# Patient Record
Sex: Female | Born: 1970 | Race: White | Hispanic: No | Marital: Married | Smoking: Never smoker
Health system: Southern US, Community
[De-identification: ages and names within clinical notes are randomized; demographics above are authoritative.]

## PROBLEM LIST (undated history)

## (undated) DIAGNOSIS — F329 Major depressive disorder, single episode, unspecified: Secondary | ICD-10-CM

## (undated) DIAGNOSIS — A64 Unspecified sexually transmitted disease: Secondary | ICD-10-CM

## (undated) DIAGNOSIS — R32 Unspecified urinary incontinence: Secondary | ICD-10-CM

## (undated) DIAGNOSIS — D219 Benign neoplasm of connective and other soft tissue, unspecified: Secondary | ICD-10-CM

## (undated) DIAGNOSIS — K625 Hemorrhage of anus and rectum: Secondary | ICD-10-CM

## (undated) DIAGNOSIS — K8020 Calculus of gallbladder without cholecystitis without obstruction: Secondary | ICD-10-CM

## (undated) DIAGNOSIS — N83209 Unspecified ovarian cyst, unspecified side: Secondary | ICD-10-CM

## (undated) DIAGNOSIS — F419 Anxiety disorder, unspecified: Secondary | ICD-10-CM

## (undated) DIAGNOSIS — M419 Scoliosis, unspecified: Secondary | ICD-10-CM

## (undated) DIAGNOSIS — C9210 Chronic myeloid leukemia, BCR/ABL-positive, not having achieved remission: Secondary | ICD-10-CM

## (undated) DIAGNOSIS — N200 Calculus of kidney: Secondary | ICD-10-CM

## (undated) DIAGNOSIS — L989 Disorder of the skin and subcutaneous tissue, unspecified: Secondary | ICD-10-CM

## (undated) DIAGNOSIS — K859 Acute pancreatitis without necrosis or infection, unspecified: Secondary | ICD-10-CM

## (undated) DIAGNOSIS — B958 Unspecified staphylococcus as the cause of diseases classified elsewhere: Secondary | ICD-10-CM

## (undated) DIAGNOSIS — D649 Anemia, unspecified: Secondary | ICD-10-CM

## (undated) DIAGNOSIS — N946 Dysmenorrhea, unspecified: Secondary | ICD-10-CM

## (undated) DIAGNOSIS — K834 Spasm of sphincter of Oddi: Secondary | ICD-10-CM

## (undated) DIAGNOSIS — D759 Disease of blood and blood-forming organs, unspecified: Secondary | ICD-10-CM

## (undated) DIAGNOSIS — F341 Dysthymic disorder: Secondary | ICD-10-CM

## (undated) DIAGNOSIS — F32A Depression, unspecified: Secondary | ICD-10-CM

## (undated) DIAGNOSIS — J349 Unspecified disorder of nose and nasal sinuses: Secondary | ICD-10-CM

## (undated) DIAGNOSIS — E079 Disorder of thyroid, unspecified: Secondary | ICD-10-CM

## (undated) HISTORY — PX: BACK SURGERY: SHX140

## (undated) HISTORY — DX: Anxiety disorder, unspecified: F41.9

## (undated) HISTORY — DX: Depression, unspecified: F32.A

## (undated) HISTORY — PX: BILE DUCT EXPLORATION: SHX1225

## (undated) HISTORY — PX: ABDOMINAL HYSTERECTOMY: SHX81

## (undated) HISTORY — DX: Benign neoplasm of connective and other soft tissue, unspecified: D21.9

## (undated) HISTORY — DX: Anemia, unspecified: D64.9

## (undated) HISTORY — DX: Unspecified urinary incontinence: R32

## (undated) HISTORY — PX: DILATION AND CURETTAGE OF UTERUS: SHX78

## (undated) HISTORY — DX: Major depressive disorder, single episode, unspecified: F32.9

## (undated) HISTORY — DX: Scoliosis, unspecified: M41.9

## (undated) HISTORY — DX: Unspecified sexually transmitted disease: A64

## (undated) HISTORY — DX: Dysmenorrhea, unspecified: N94.6

## (undated) HISTORY — PX: CHOLECYSTECTOMY: SHX55

## (undated) HISTORY — DX: Disease of blood and blood-forming organs, unspecified: D75.9

---

## 1996-04-19 DIAGNOSIS — A64 Unspecified sexually transmitted disease: Secondary | ICD-10-CM

## 1996-04-19 HISTORY — DX: Unspecified sexually transmitted disease: A64

## 2005-04-19 HISTORY — PX: TUMOR REMOVAL: SHX12

## 2005-04-19 HISTORY — PX: ESSURE TUBAL LIGATION: SUR464

## 2007-04-20 DIAGNOSIS — D759 Disease of blood and blood-forming organs, unspecified: Secondary | ICD-10-CM

## 2007-04-20 HISTORY — DX: Disease of blood and blood-forming organs, unspecified: D75.9

## 2011-04-20 HISTORY — PX: UMBILICAL HERNIA REPAIR: SHX196

## 2013-03-09 ENCOUNTER — Ambulatory Visit
Admission: RE | Admit: 2013-03-09 | Discharge: 2013-03-09 | Disposition: A | Discharge status: Home / Self Care | Payer: 59 | Source: Ambulatory Visit | Attending: Family Medicine | Admitting: Family Medicine

## 2013-03-09 ENCOUNTER — Other Ambulatory Visit: Payer: Self-pay | Admitting: Family Medicine

## 2013-03-09 DIAGNOSIS — N200 Calculus of kidney: Secondary | ICD-10-CM

## 2013-04-04 ENCOUNTER — Encounter (HOSPITAL_BASED_OUTPATIENT_CLINIC_OR_DEPARTMENT_OTHER): Payer: Self-pay | Admitting: Emergency Medicine

## 2013-04-04 ENCOUNTER — Emergency Department (HOSPITAL_COMMUNITY): Payer: 59

## 2013-04-04 ENCOUNTER — Emergency Department (HOSPITAL_BASED_OUTPATIENT_CLINIC_OR_DEPARTMENT_OTHER): Payer: 59

## 2013-04-04 ENCOUNTER — Emergency Department (HOSPITAL_BASED_OUTPATIENT_CLINIC_OR_DEPARTMENT_OTHER)
Admission: EM | Admit: 2013-04-04 | Discharge: 2013-04-04 | Disposition: A | Discharge status: Home / Self Care | Payer: 59 | Attending: Emergency Medicine | Admitting: Emergency Medicine

## 2013-04-04 DIAGNOSIS — N200 Calculus of kidney: Secondary | ICD-10-CM | POA: Insufficient documentation

## 2013-04-04 DIAGNOSIS — R109 Unspecified abdominal pain: Secondary | ICD-10-CM | POA: Insufficient documentation

## 2013-04-04 DIAGNOSIS — E079 Disorder of thyroid, unspecified: Secondary | ICD-10-CM | POA: Insufficient documentation

## 2013-04-04 DIAGNOSIS — L989 Disorder of the skin and subcutaneous tissue, unspecified: Secondary | ICD-10-CM | POA: Insufficient documentation

## 2013-04-04 DIAGNOSIS — N83209 Unspecified ovarian cyst, unspecified side: Secondary | ICD-10-CM | POA: Insufficient documentation

## 2013-04-04 DIAGNOSIS — K859 Acute pancreatitis without necrosis or infection, unspecified: Secondary | ICD-10-CM | POA: Insufficient documentation

## 2013-04-04 HISTORY — DX: Acute pancreatitis without necrosis or infection, unspecified: K85.90

## 2013-04-04 HISTORY — DX: Unspecified ovarian cyst, unspecified side: N83.209

## 2013-04-04 HISTORY — DX: Disorder of the skin and subcutaneous tissue, unspecified: L98.9

## 2013-04-04 HISTORY — DX: Calculus of kidney: N20.0

## 2013-04-04 HISTORY — DX: Unspecified disorder of nose and nasal sinuses: J34.9

## 2013-04-04 HISTORY — DX: Spasm of sphincter of Oddi: K83.4

## 2013-04-04 HISTORY — DX: Disorder of thyroid, unspecified: E07.9

## 2013-04-04 LAB — WET PREP, GENITAL: Trich, Wet Prep: NONE SEEN

## 2013-04-04 LAB — COMPREHENSIVE METABOLIC PANEL
ALT: 21 U/L (ref 0–35)
AST: 22 U/L (ref 0–37)
Alkaline Phosphatase: 95 U/L (ref 39–117)
CO2: 22 mEq/L (ref 19–32)
Calcium: 9.5 mg/dL (ref 8.4–10.5)
Chloride: 103 mEq/L (ref 96–112)
GFR calc Af Amer: >90 mL/min (ref >90)
GFR calc non Af Amer: >90 mL/min (ref >90)
Glucose, Bld: 107 mg/dL — ABNORMAL HIGH (ref 70–99)
Potassium: 3.7 mEq/L (ref 3.5–5.1)
Sodium: 138 mEq/L (ref 135–145)
Total Bilirubin: 0.3 mg/dL (ref 0.3–1.2)
Total Protein: 7.8 g/dL (ref 6.0–8.3)

## 2013-04-04 LAB — CBC WITH DIFFERENTIAL/PLATELET
Basophils Relative: 1 % (ref 0–1)
Eosinophils Relative: 5 % (ref 0–5)
Lymphocytes Relative: 26 % (ref 12–46)
Lymphs Abs: 2 10*3/uL (ref 0.7–4.0)
MCHC: 33.7 g/dL (ref 30.0–36.0)
MCV: 88.8 fL (ref 78.0–100.0)
Neutro Abs: 4.6 10*3/uL (ref 1.7–7.7)
Platelets: 374 10*3/uL (ref 150–400)
RBC: 4.38 MIL/uL (ref 3.87–5.11)
RDW: 12.7 % (ref 11.5–15.5)
WBC: 7.7 10*3/uL (ref 4.0–10.5)

## 2013-04-04 LAB — URINALYSIS, ROUTINE W REFLEX MICROSCOPIC
Bilirubin Urine: NEGATIVE
Ketones, ur: NEGATIVE mg/dL
Nitrite: NEGATIVE
Specific Gravity, Urine: 1.017 (ref 1.005–1.030)
Urobilinogen, UA: 0.2 mg/dL (ref 0.0–1.0)
pH: 5.5 (ref 5.0–8.0)

## 2013-04-04 MED ORDER — OXYCODONE-ACETAMINOPHEN 5-325 MG PO TABS: 2.0000 | ORAL_TABLET | ORAL | Status: DC | PRN

## 2013-04-04 MED ORDER — ONDANSETRON HCL 4 MG/2ML IJ SOLN
4.0000 mg | Freq: Once | INTRAMUSCULAR | Status: AC
  Administered 2013-04-04: 4 mg via INTRAVENOUS
  Filled 2013-04-04: qty 2

## 2013-04-04 MED ORDER — KETOROLAC TROMETHAMINE 30 MG/ML IJ SOLN
30.0000 mg | Freq: Once | INTRAMUSCULAR | Status: AC
  Administered 2013-04-04: 30 mg via INTRAVENOUS

## 2013-04-04 MED ORDER — NAPROXEN 500 MG PO TABS: 500.0000 mg | ORAL_TABLET | Freq: Two times a day (BID) | ORAL | Status: DC

## 2013-04-04 MED ORDER — IOHEXOL 300 MG/ML  SOLN
100.0000 mL | Freq: Once | INTRAMUSCULAR | Status: AC | PRN
  Administered 2013-04-04: 100 mL via INTRAVENOUS

## 2013-04-04 MED ORDER — KETOROLAC TROMETHAMINE 30 MG/ML IJ SOLN
INTRAMUSCULAR | Status: AC
  Filled 2013-04-04: qty 1

## 2013-04-04 MED ORDER — GLYCOPYRROLATE 0.2 MG/ML IJ SOLN
0.1000 mg | Freq: Once | INTRAMUSCULAR | Status: AC
  Administered 2013-04-04: 0.1 mg via INTRAVENOUS
  Filled 2013-04-04: qty 1

## 2013-04-04 MED ORDER — IOHEXOL 300 MG/ML  SOLN
50.0000 mL | Freq: Once | INTRAMUSCULAR | Status: AC | PRN
  Administered 2013-04-04: 50 mL via ORAL

## 2013-04-04 MED ORDER — HYDROMORPHONE HCL PF 1 MG/ML IJ SOLN
1.0000 mg | Freq: Once | INTRAMUSCULAR | Status: AC
  Administered 2013-04-04: 1 mg via INTRAMUSCULAR
  Filled 2013-04-04: qty 1

## 2013-04-04 NOTE — ED Provider Notes (Signed)
CT scan shows to some in her right adnexal cyst. No free fluid in the pelvis. No sign of appendicitis. No hydroureteronephrosis. Think her symptoms are consistent with an ovarian cyst. This began slowly 2 days ago was not sudden in onset I do not think this is consistent with torsion. Plan will be sent control. GYN referral. Anti-inflammatories.  Roney Marion, MD 04/04/13 [phone]

## 2013-04-04 NOTE — ED Provider Notes (Signed)
Please see Dr. Sandi Carne note above.  Pt examined.  C/O midline suprapubic pain.  Seems to be uncomfortable, but not writhing in pain.  H/o kidney stones, but states this is different.  H/O ovarian cysts, but it's been some time and can't remember that.  LMP Mid November, normal for Patient.  No peritoneal irritation. Normal WBC.  Does have trace Hb on Dip UA.  CT ordered, and pending.  Offered pain meds, declines.  Given Zofran and robinul.  Pt takes Oxycontin 30 mg bid for LBP, and has not ran out or had change in dosage.  Roney Marion, MD 04/04/13 2220

## 2013-04-04 NOTE — ED Notes (Signed)
Patient transported to CT 

## 2013-04-04 NOTE — ED Notes (Signed)
Bed: RU04 Expected date:  Expected time:  Means of arrival:  Comments: tx [facility]

## 2013-04-04 NOTE — ED Notes (Signed)
Pt to driver herself to [facility] hospital for ct abdomen, constrast given for patient to drink as she is traveling to [place] [facility]

## 2013-04-04 NOTE — ED Notes (Signed)
Pt given emtala paperwork for tx to [place], pt drinking contrast, tiffany, charge RN at [facility] long notified of patient's arrival and that she is going pov and IV site.

## 2013-04-04 NOTE — ED Provider Notes (Signed)
CSN: [id number]     Arrival date & time 04/04/13  1733 History  This chart was scribed for Mariah Lyons, MD by Leone Payor, ED Scribe. This patient was seen in room MH02/MH02 and the patient's care was started 6:08 PM.    Chief Complaint  Patient presents with  . Abdominal Pain    The history is provided by the patient. No language interpreter was used.    HPI Comments: Mariah Reyes is a 42 y.o. female who presents to the Emergency Department complaining of constant, gradually worsening lower abdominal and lower back pain that began 3 days ago. She has associated cloudy urine and genital itching and burning. She also reports having a low grade fever of around 99. She reports having a leftover prescription of Diflucan which she has taken. Pt also reports having a history of back pain but she states this pain is different. She takes Oxycodone 30 mg 2-3 times per day which is prescribed by pain management. She has a surgical history of cholecystectomy.    Past Medical History  Diagnosis Date  . Kidney stones   . Skin disorder   . Pancreatitis   . Sphincter of Oddi dysfunction   . Ovarian cyst   . Sinus disease   . Thyroid disease    Past Surgical History  Procedure Laterality Date  . Cholecystectomy    . Back surgery     No family history on file. History  Substance Use Topics  . Smoking status: Never Smoker   . Smokeless tobacco: Not on file  . Alcohol Use: Yes   OB History   Grav Para Term Preterm Abortions TAB SAB Ect Mult Living                 Review of Systems A complete 10 system review of systems was obtained and all systems are negative except as noted in the HPI and PMH.   Allergies  Ciprofloxacin hcl and Latex  Home Medications   Current Outpatient Rx  Name  Route  Sig  Dispense  Refill  . fexofenadine-pseudoephedrine (ALLEGRA-D) 60-120 MG per tablet   Oral   Take 1 tablet by mouth 2 (two) times daily.         Marland Kitchen FLUoxetine (PROZAC) 40 MG capsule  Oral   Take 40 mg by mouth daily.         Marland Kitchen levothyroxine (SYNTHROID, LEVOTHROID) 25 MCG tablet   Oral   Take 25 mcg by mouth daily before breakfast.          BP 125/82  Pulse 94  Temp(Src) 98.4 F (36.9 C) (Oral)  Resp 16  SpO2 100%  LMP 03/16/2013 Physical Exam  Nursing note and vitals reviewed. Constitutional: She is oriented to person, place, and time. She appears well-developed and well-nourished.  Pt appears anxious and jittery.   HENT:  Head: Normocephalic and atraumatic.  Cardiovascular: Normal rate, regular rhythm and normal heart sounds.   Pulmonary/Chest: Effort normal and breath sounds normal. No respiratory distress.  Abdominal: Soft. She exhibits no distension. There is tenderness. There is no rebound and no guarding.  Tenderness to palpation across the lower abdomen in the RLQ and LLQ and suprapubic region.   Genitourinary: Uterus normal. Vaginal discharge found.  There is a mild to moderate whitish vaginal discharge present. There is no cervical motion tenderness or adnexal tenderness or masses  Neurological: She is alert and oriented to person, place, and time.  Skin: Skin is warm and dry.  Psychiatric: She has a normal mood and affect.    ED Course  Procedures   DIAGNOSTIC STUDIES: Oxygen Saturation is 100% on RA, normal by my interpretation.    COORDINATION OF CARE: 6:13 PM Discussed treatment plan with pt at bedside and pt agreed to plan.   Labs Review Labs Reviewed  URINALYSIS, ROUTINE W REFLEX MICROSCOPIC - Abnormal; Notable for the following:    Hgb urine dipstick TRACE (*)    Leukocytes, UA SMALL (*)    All other components within normal limits  COMPREHENSIVE METABOLIC PANEL - Abnormal; Notable for the following:    Glucose, Bld 107 (*)    All other components within normal limits  PREGNANCY, URINE  CBC WITH DIFFERENTIAL  URINE MICROSCOPIC-ADD ON   Imaging Review No results found.    MDM  No diagnosis found. Patient presents  here with lower abdominal pain for the past 3 days. She is reporting severe pain in the ER, however workup is unremarkable. There is no elevation of white count in the urine is essentially clear. Pelvic examination does not reveal any obvious abnormality. As there is no explanation for her discomfort I feel as though a CT is indicated. The scanner he is not operational this evening, hence the patient will be sent to [facility] for completion of her workup. I've spoken with Dr. Fayrene Fearing who agrees to accept the patient in transfer.  I personally performed the services described in this documentation, which was scribed in my presence. The recorded information has been reviewed and is accurate.      Mariah Lyons, MD 04/04/13 2008

## 2013-04-04 NOTE — ED Notes (Signed)
Onset of burning/itching to her genital area x several weeks.  Unable to secure an appointment with her PMD, so she took a Diflucan tablet that she had left over.  Onset Sunday of lower abdominal pain, mucous in her urine.  Now abdominal pain worse, radiating into her back.  Patient is pale, c/o hot/cold flashes.  Also reports fever at home.

## 2013-04-07 LAB — GC/CHLAMYDIA PROBE AMP: GC Probe RNA: NEGATIVE

## 2013-05-04 ENCOUNTER — Ambulatory Visit (INDEPENDENT_AMBULATORY_CARE_PROVIDER_SITE_OTHER): Payer: 59 | Admitting: Obstetrics and Gynecology

## 2013-05-04 ENCOUNTER — Encounter: Payer: Self-pay | Admitting: Obstetrics and Gynecology

## 2013-05-04 ENCOUNTER — Telehealth: Payer: Self-pay | Admitting: Obstetrics and Gynecology

## 2013-05-04 VITALS — BP 120/86 | HR 86 | Resp 16 | Ht 62.75 in | Wt 165.0 lb

## 2013-05-04 DIAGNOSIS — R109 Unspecified abdominal pain: Secondary | ICD-10-CM

## 2013-05-04 DIAGNOSIS — R195 Other fecal abnormalities: Secondary | ICD-10-CM

## 2013-05-04 DIAGNOSIS — K921 Melena: Secondary | ICD-10-CM

## 2013-05-04 DIAGNOSIS — N926 Irregular menstruation, unspecified: Secondary | ICD-10-CM

## 2013-05-04 DIAGNOSIS — N939 Abnormal uterine and vaginal bleeding, unspecified: Secondary | ICD-10-CM

## 2013-05-04 DIAGNOSIS — N83209 Unspecified ovarian cyst, unspecified side: Secondary | ICD-10-CM

## 2013-05-04 DIAGNOSIS — N83201 Unspecified ovarian cyst, right side: Secondary | ICD-10-CM

## 2013-05-04 LAB — POCT URINALYSIS DIPSTICK
BILIRUBIN UA: NEGATIVE
Blood, UA: 250
GLUCOSE UA: NEGATIVE
KETONES UA: NEGATIVE
Leukocytes, UA: NEGATIVE
Nitrite, UA: NEGATIVE
Protein, UA: NEGATIVE
Urobilinogen, UA: NEGATIVE
pH, UA: 5

## 2013-05-04 NOTE — Patient Instructions (Signed)
We will call you to schedule your ultrasound, sonohysterogram, and possible biopsy - all as an in office procedure.

## 2013-05-04 NOTE — Telephone Encounter (Signed)
Gay Filler,  Please contact this patient to schedule her SHGM. I called her back but she prefers to have it done on 01.22.2015, but I explained that the schedule is pretty full. She then requested 01.29.2015, but Dr. Quincy Simmonds will be out of the office. The patient insists that she does not want to wait until February 05.  Thank you, Gabriel Cirri

## 2013-05-04 NOTE — Telephone Encounter (Signed)
Left message for patient to call back/wanted to advised patient that she is scheduled with Dr. Collene Mares 01.20.2015 @1030 Mariah Reyes wanted to go over benefits for and schedule SHGM/ssf

## 2013-05-04 NOTE — Progress Notes (Signed)
GYNECOLOGY PROBLEM VISIT  PCP: [facility]   Referring provider:   HPI: 43 y.o.   Married  Caucasian  female   [phone] with Patient's last menstrual period was 04/23/2013.   Patient is here for abnormal uterine bleeding, pelvic/abdominal pain, and a right ovarian cyst noted on pelvic ultrasound.  Bled 12/24 and 12/25 and started again 1/5 - until now.  Can skip menses 2 - 3 times a year.  When has cycles, they can be heavy.   Abdominal pain began in November. Initial concern was for a potential kidney stone.   Had a CT scan at [facility] in November 2014 which was normal. Patient had another spike in pain which prompted a second CT scan, on 04/04/13 which showed a 3 cm right ovarian cyst and an umbilical hernia.   GC/CT negative.  UPT negative.  Patient associates the spike in pain with vaginal bleeding.   Pain is a consistent cramp. Feels better when she pushes in on it or lays on her side. Does not know if anything makes it worse. Worsens when stops Aleve.   Reports fatigue and headaches.    Has had spinal surgery, discectomy with a spacer placed.  Chronic pain since then.   Some nausea.  No vomiting.  No diarrhea. No constipation.    Some black stools.   Colonoscopy one year ago.  "Looked roughed up."  States black tarry stools.  No GI physician caring for patient here.   One month ago had urinary frequency for one week. Microscopic hematuria on UA in ER.  Having nonhealing skin lesions that are nonhealing and is seeing a dermatologist soon.   GYNECOLOGIC HISTORY: Patient's last menstrual period was 04/23/2013. Sexually active:  yes Partner preference: female, no new partners.  Contraception:  Essure  Menopausal hormone therapy: no DES exposure:   no Blood transfusions:   no Sexually transmitted diseases:  Trich 1998  GYN Procedures: Essure / D&C Mammogram:   01/2013 normal              Pap: 2014 neg   History of abnormal pap smear:  Many years  ago (repeat pap normal)   OB History   Grav Para Term Preterm Abortions TAB SAB Ect Mult Living   5 1   4 3 1   1          Family History  Problem Relation Age of Onset  . Hyperlipidemia Mother   . Hypertension Father   . Breast cancer Paternal Grandmother     There are no active problems to display for this patient.   Past Medical History  Diagnosis Date  . Kidney stones   . Skin disorder     lesion on forehead at eyebrow  . Pancreatitis   . Sphincter of Oddi dysfunction   . Ovarian cyst   . Sinus disease   . Thyroid disease   . Anemia   . Anxiety   . Blood disorder 2009  . Depression   . Dysmenorrhea   . Fibroid   . Scoliosis   . STD (sexually transmitted disease) [place]  . Urinary incontinence     Past Surgical History  Procedure Laterality Date  . Cholecystectomy    . Back surgery    . Bile duct exploration  2006 2008  . Umbilical hernia repair  2013  . 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 Dilation and curettage of uterus    .  Essure tubal ligation  2007    ALLERGIES: Ciprofloxacin hcl and Latex  Current Outpatient Prescriptions  Medication Sig Dispense Refill  . ALPRAZolam (XANAX) 0.5 MG tablet Take 0.5 mg by mouth 2 (two) times daily as needed for anxiety.       Marland Kitchen amphetamine-dextroamphetamine (ADDERALL XR) 25 MG 24 hr capsule Take 25 mg by mouth every morning.      . fexofenadine-pseudoephedrine (ALLEGRA-D) 60-120 MG per tablet Take 1 tablet by mouth every morning.       Marland Kitchen FLUoxetine (PROZAC) 40 MG capsule Take 40 mg by mouth every morning.       Marland Kitchen levothyroxine (SYNTHROID, LEVOTHROID) 25 MCG tablet Take 25 mcg by mouth daily before breakfast.      . naproxen (NAPROSYN) 500 MG tablet Take 1 tablet (500 mg total) by mouth 2 (two) times daily.  30 tablet  0  . oxyCODONE-acetaminophen (PERCOCET/ROXICET) 5-325 MG per tablet Take 2 tablets by mouth every 4 (four) hours as needed.  6 tablet  0  . OXYCONTIN 30 MG T12A Take 30 mg by mouth 2  (two) times daily as needed (pain).       Marland Kitchen PROAIR HFA 108 (90 BASE) MCG/ACT inhaler as needed.       Marland Kitchen QUEtiapine (SEROQUEL) 50 MG tablet Take 50 mg by mouth at bedtime.      Marland Kitchen zolpidem (AMBIEN CR) 12.5 MG CR tablet Take 12.5 mg by mouth at bedtime.      . fluconazole (DIFLUCAN) 150 MG tablet       . Meth-Hyo-M Bl-Na Phos-Ph Sal (URIBEL) 118 MG CAPS Take 1 capsule by mouth once as needed (UTI).      Marland Kitchen ondansetron (ZOFRAN-ODT) 4 MG disintegrating tablet Take 4 mg by mouth 3 (three) times daily as needed for nausea or vomiting.       Marland Kitchen tiZANidine (ZANAFLEX) 4 MG capsule Take 4 mg by mouth as needed for muscle spasms.        No current facility-administered medications for this visit.     ROS:  Pertinent items are noted in HPI.  SOCIAL HISTORY:  Not working.   PHYSICAL EXAMINATION:    BP 120/86  Pulse 86  Resp 16  Ht 5' 2.75" (1.594 m)  Wt 165 lb (74.844 kg)  BMI 29.46 kg/m2  LMP 04/23/2013   Wt Readings from Last 3 Encounters:  05/04/13 165 lb (74.844 kg)     Ht Readings from Last 3 Encounters:  05/04/13 5' 2.75" (1.594 m)    General appearance: Alternates between agitation and sedation.  Difficult historian.  Head: Normocephalic, without obvious abnormality, atraumatic Neck: no adenopathy, supple, symmetrical, trachea midline and thyroid not enlarged, symmetric, no tenderness/mass/nodules Lungs: clear to auscultation bilaterally Heart: regular rate and rhythm Abdomen: Periumbilical incisions, soft, non-tender; no masses,  no organomegaly Extremities: extremities normal, atraumatic, no cyanosis or edema Skin: Skin color, texture, turgor normal. Ulcerative wound of the right forehead. Lymph nodes: Cervical, supraclavicular, and axillary nodes normal. No abnormal inguinal nodes palpated    Pelvic: External genitalia:  no lesions              Urethra:  normal appearing urethra with no masses, tenderness or lesions              Bartholins and Skenes: normal                  Vagina: normal appearing vagina with normal color and discharge, no lesions.  Vaginal blood noted.  No  active bleeding.               Cervix: normal appearance.  No CMT.                     Bimanual Exam:  Uterus:  uterus is normal size, shape, consistency, retroverted and tender posterior fundus.                                      Adnexa: normal adnexa in size, nontender and no masses                                      Rectovaginal: Confirms                                      Anus:  normal sphincter tone, no lesions  ASSESSMENT  Abdominal and pelvic pain of unclear etiology.   Left ovarian cyst on CT scan.  Recent menometrorrhagia.  Possible anovulatory cycle.  Status post Essure.  Status post  multiple surgeries with umbilical approach - exploratory laparoscopy to rule out ectopic, cholecystectomy, umbilical herniorrhaphy History of pancreatitis. Chronic back pain.  On narcotics. Nonhealing skin ulcers. Black stools  PLAN  Return for a pelvic ultrasound, sonohysterogram, and potential endometrial biopsy. Referral to GI for black stools and abdominal pain.    An After Visit Summary was printed and given to the patient.  25 minutes face to face time of which over 25% was spent in counseling.

## 2013-05-04 NOTE — Telephone Encounter (Signed)
Patient is calling [place] back

## 2013-05-07 NOTE — Telephone Encounter (Signed)
Work in for 05-10-13 approved by Dr Quincy Simmonds and Gabriel Cirri has notified patient. Encounter closed.

## 2013-05-07 NOTE — Telephone Encounter (Signed)
Advised that patient had questions regarding upcoming procedure.Message left to return call to Christmas at [phone].

## 2013-05-09 ENCOUNTER — Telehealth: Payer: Self-pay | Admitting: Obstetrics and Gynecology

## 2013-05-09 NOTE — Telephone Encounter (Signed)
Please have then let us know if she is able to see the patient after her records are reviewed.

## 2013-05-09 NOTE — Telephone Encounter (Signed)
Records faxed to Dr. Lorie Apley office for review.

## 2013-05-09 NOTE — Telephone Encounter (Signed)
Message for Dr Quincy Simmonds: Did not see patient yesterday had to sent away because they needed her records, has a history of chronic pancreatis and sphincter of oddi. They may not be able to help her and she may need to go to a Teaching hospital. Once they get the records Dr Collene Mares will review them and let us know what her recommendations are.

## 2013-05-09 NOTE — Telephone Encounter (Signed)
Unable to get fax to go through to Dr. Lorie Apley office. Call to office, nurse reports they need records from out of state instead of our office notes. Pt was able to tell them the name of the hospital where she was seen from 2005 to 2012 for these issues. They have requested records to be rushed and Dr. Collene Mares will review, but it is very likely they will send her to a teaching hospital.

## 2013-05-10 ENCOUNTER — Encounter: Payer: Self-pay | Admitting: Obstetrics and Gynecology

## 2013-05-10 ENCOUNTER — Other Ambulatory Visit: Payer: Self-pay | Admitting: Obstetrics and Gynecology

## 2013-05-10 ENCOUNTER — Ambulatory Visit (INDEPENDENT_AMBULATORY_CARE_PROVIDER_SITE_OTHER): Payer: 59 | Admitting: Obstetrics and Gynecology

## 2013-05-10 ENCOUNTER — Ambulatory Visit (INDEPENDENT_AMBULATORY_CARE_PROVIDER_SITE_OTHER): Payer: 59

## 2013-05-10 VITALS — BP 110/64 | HR 80 | Resp 16 | Wt 158.0 lb

## 2013-05-10 DIAGNOSIS — M25559 Pain in unspecified hip: Secondary | ICD-10-CM

## 2013-05-10 DIAGNOSIS — K921 Melena: Secondary | ICD-10-CM | POA: Insufficient documentation

## 2013-05-10 DIAGNOSIS — N83201 Unspecified ovarian cyst, right side: Secondary | ICD-10-CM

## 2013-05-10 DIAGNOSIS — N83209 Unspecified ovarian cyst, unspecified side: Secondary | ICD-10-CM

## 2013-05-10 DIAGNOSIS — N939 Abnormal uterine and vaginal bleeding, unspecified: Secondary | ICD-10-CM

## 2013-05-10 DIAGNOSIS — N926 Irregular menstruation, unspecified: Secondary | ICD-10-CM

## 2013-05-10 DIAGNOSIS — R195 Other fecal abnormalities: Secondary | ICD-10-CM

## 2013-05-10 DIAGNOSIS — R109 Unspecified abdominal pain: Secondary | ICD-10-CM

## 2013-05-10 NOTE — Progress Notes (Signed)
Subjective  Patient is here for pelvic ultrasound, sonohysterogram, and EMB. Patient has pelvic/abdominal pain, recent abnormal bleeding over approx. one month, and history of some skipped menses.  Used OCPs in the past and forgot to take. Had weight gain with DepoProvera. Status post Essure 7 years ago.   Asking about removal due to skin problems, which started 2 years ago.   States nickel allergy and asks to confirm the metals in Essure.  Started dapsone for skin lesions of undetermined etiology.  Seeing a new Dermatologist.  They are requesting records before proceeding with any biopsies.    History of cholecystectomy laparoscopically and laparoscopy for rule out ectopic. History of umbilical hernia and no mesh placement. History of chronic back pain and surgeries.  History of pancreatitis.  Objective  See ultrasound below:  Uterus 9.0 x 6.5 x 5.1 cm.  EMS 9.67 cm.  Right ovary WNL,  Left ovary 2.3 cm follicular cyst.     Procedures  Sonohysterogram and EMB  Consent for procedures. Speculum placed in vagina. Sterile prep of cervix with Hibiclens. Cannula placed in uterine cavity. Speculum withdrawn. Vaginal probe placed. Saline infused. No filling defects noted.  Speculum replaced.  Cervix resterilized. Tenaculum to anterior cervical lip. Pipelle passed twice. Tissue to pathology. Minimal EBL.  No complications to procedures.   Assessment  Chronic abdominal/pelvic pain with recent acute exacerbation accompanied by abnormal uterine bleeding. Status post Essure. Nickel allergy. Nonhealing skin lesions, under evaluation.  Status post laparoscopic surgeries. History of pancreatitis. Chronic back pain and history of back surgeries. (Black stools at visit on 05/04/13.)  Plan  Follow up EMB.  I told the patient she may receive a phone call with result of biopsy while I am out of office next week. I will review upon my return.  We reviewed the components of the  Essure, which does include nickel. We discussed hysterectomy with bilateral salpingectomy versus hysterectomy with BSO.  We discussed a potential robotic approach.  I reviewed benefits and risks of the procedure - bleeding, infection, damage to surrounding organs, DVT, PE, death, reaction to anesthesia, pneumonia, need for reoperation, need for hormone therapy after ovarian removal, possible continued pain following hysterectomy but absent menstruation.    (Please note that I have already made a referral to Dr. Collene Mares to also perform GI evaluation.  Dr. Collene Mares is requesting records to see it if is an appropriate referral.)  30 minutes face to face time of which over 50 % was spent in counseling.   After visit summary to patient.

## 2013-05-10 NOTE — Patient Instructions (Signed)
We will contact you about your biopsy results.  Call for fever, increasing pain, or heavy bleeding.

## 2013-05-14 LAB — IPS CERVICAL/ECC/EMB/VULVAR/VAGINAL BIOPSY

## 2013-05-21 NOTE — Telephone Encounter (Signed)
Amy, can you check on this referral.

## 2013-05-21 NOTE — Telephone Encounter (Signed)
Thank you for your follow through in helping this patient receive her referral to Gastroenterology.

## 2013-05-21 NOTE — Telephone Encounter (Signed)
Call to Dr. Lorie Apley office, spoke with Butch Penny. They had a delay in getting pt records because the name of the hospital the pt told them was not the correct one. She reports that the pt found some of her old records and brought them in for Dr. Collene Mares to review. Dr. Collene Mares has not had time to read them all yet, but they will let us know if they decide to see her.

## 2013-05-22 MED ORDER — NORETHIN ACE-ETH ESTRAD-FE 1-20 MG-MCG PO TABS: 1.0000 | ORAL_TABLET | Freq: Every day | ORAL | Status: DC

## 2013-05-22 NOTE — Telephone Encounter (Signed)
Red bleeding has stopped.  Now having blackish bleeding. Still has abdominal pain and is awaiting GI consult.  Used OCPs in past without problem or complication. Status post Essure.  EMB benign.  I discussed options for treatment of bleeding and pain.  Will proceed with low dose OCPs - LoEstrin 1/20 x 6 months. Benefits and risks of thromboembolic events reviewed.  Will call for a follow up appointment for 2 - 3 months, sooner as needed.

## 2013-05-23 ENCOUNTER — Telehealth: Payer: Self-pay | Admitting: Obstetrics and Gynecology

## 2013-05-23 NOTE — Telephone Encounter (Signed)
Starla Curl at 05/23/2013 9:18 AM     Status: Signed        Butch Penny from Dr. Lorie Apley office called to let Amy know Dr. Collene Mares will see this patient. (See last closed phone note.)

## 2013-05-23 NOTE — Telephone Encounter (Signed)
Butch Penny from Dr. Lorie Apley office called to let Amy know Dr. Collene Mares will see this patient. (See last closed phone note.)

## 2013-05-24 NOTE — Telephone Encounter (Signed)
Thank you for your follow through.  

## 2013-06-06 ENCOUNTER — Telehealth: Payer: Self-pay | Admitting: Obstetrics and Gynecology

## 2013-06-06 NOTE — Telephone Encounter (Signed)
Spoke with patient. She has questions about her discussion with Dr. Quincy Simmonds. She states that she would like to go forward with "hysterectomy with removal of fallopian tubes but not removal of the ovaries."  Do you need to see patient back for consult visit to discuss? I didn't know if previous visit was sufficient to go ahead with scheduling?

## 2013-06-06 NOTE — Telephone Encounter (Signed)
Patient has some questions about hysterectomy .

## 2013-06-06 NOTE — Telephone Encounter (Signed)
Mariah Reyes,  Will you please schedule an appointment for me to see the patient before we schedule surgery. I want to review her incisions on her abdomen and her previous surgeries.  Also, she is in the process of a GI evaluation with Dr. Collene Mares for her abdominal pain.    Thanks.

## 2013-06-07 NOTE — Telephone Encounter (Signed)
Spoke with patient and she is agreeable to consult appointment with Dr. Quincy Simmonds. Scheduled for 06/18/13 at 1:30.  Routing to provider for final review. Patient agreeable to disposition. Will close encounter

## 2013-06-18 ENCOUNTER — Ambulatory Visit (INDEPENDENT_AMBULATORY_CARE_PROVIDER_SITE_OTHER): Payer: 59 | Admitting: Obstetrics and Gynecology

## 2013-06-18 ENCOUNTER — Encounter: Payer: Self-pay | Admitting: Obstetrics and Gynecology

## 2013-06-18 VITALS — BP 120/68 | HR 80 | Ht 62.75 in | Wt 162.0 lb

## 2013-06-18 DIAGNOSIS — N920 Excessive and frequent menstruation with regular cycle: Secondary | ICD-10-CM

## 2013-06-18 DIAGNOSIS — M25559 Pain in unspecified hip: Secondary | ICD-10-CM

## 2013-06-18 NOTE — Progress Notes (Signed)
Patient ID: Mariah Reyes, female   DOB: 13-Nov-1970, 43 y.o.   MRN: [id number] GYNECOLOGY PROBLEM VISIT  PCP:   [facility]  Referring provider:   HPI: 43 y.o.   Married  Caucasian  female   [phone] with Patient's last menstrual period was 06/11/2013.  Status post Essure. here for  Discussion of possible hysterectomy and bilateral salpingectomy for heavy menses with clotting and pelvic and abdominal pain. Recently started OCPs and does not note a big difference in her pain or bleeding.  Pain can also occur when she is not on her menses.   Ultrasound showed 2.3 cm left ovarian follicular cyst.  Uterus 9.0 x 6.5 x 5.1 cm. EMS 9.67 cm. Right ovary WNL. Sonohysterogram showed no filling defects noted.  EMB showed early proliferative endometrium, negative for hyperplasia or malignancy.  GC/CT negative 04/04/13.  Had colonoscopy with Dr. Collene Mares for diarrhea and blood clots in the stool.   Had normal evaluation.  Declines future childbearing.  Concerned about the Essure and how it could be affecting her health and her skin rash as she has a nickel allergy.  Wants to keep ovaries if possible.   History of cholecystectomy laparoscopically and laparoscopy for rule out ectopic.   Told she has adhesions.  History of umbilical hernia and no mesh placement.  History of chronic back pain and surgeries.  Uses narcotics chronically.  History of pancreatitis.  Occasional loss of control of urine with cough, laugh.  Not regularly.  Does not use pads.   GYNECOLOGIC HISTORY: Patient's last menstrual period was 06/11/2013. Sexually active:  yes Partner preference: female Contraception:   OCP's Menopausal hormone therapy: n/a DES exposure:   no Blood transfusions:  no  Sexually transmitted diseases:   [place] procedures and prior surgeries:  D & C, Essure Last mammogram:  01/2013 wnl               Last pap and high risk HPV testing:   2014 wnl History of  abnormal pap smear:  History of abnormal pap years ago.   OB History   Grav Para Term Preterm Abortions TAB SAB Ect Mult Living   5 1   4 3 1   1          Family History  Problem Relation Age of Onset  . Hyperlipidemia Mother   . Hypertension Father   . Breast cancer Paternal Grandmother     Patient Active Problem List   Diagnosis Date Noted  . Black stools 05/10/2013    Past Medical History  Diagnosis Date  . Kidney stones   . Skin disorder     lesion on forehead at eyebrow  . Pancreatitis   . Sphincter of Oddi dysfunction   . Ovarian cyst   . Sinus disease   . Thyroid disease   . Anemia   . Anxiety   . Blood disorder 2009  . Depression   . Dysmenorrhea   . Fibroid   . Scoliosis   . STD (sexually transmitted disease) [place]  . Urinary incontinence     Past Surgical History  Procedure Laterality Date  . Cholecystectomy    . Back surgery    . Bile duct exploration  2006 2008  . Umbilical hernia repair  2013  . 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 Dilation and curettage of uterus    . Essure tubal ligation  2007  ALLERGIES: Ciprofloxacin hcl and Latex  Current Outpatient Prescriptions  Medication Sig Dispense Refill  . ACZONE 5 % topical gel Apply 1 application topically 2 (two) times daily.      Marland Kitchen ALPRAZolam (XANAX) 0.5 MG tablet Take 0.5 mg by mouth 2 (two) times daily as needed for anxiety.       Marland Kitchen amphetamine-dextroamphetamine (ADDERALL XR) 25 MG 24 hr capsule Take 25 mg by mouth every morning.      . fexofenadine-pseudoephedrine (ALLEGRA-D) 60-120 MG per tablet Take 1 tablet by mouth every morning.       Marland Kitchen FLUoxetine (PROZAC) 40 MG capsule Take 40 mg by mouth every morning.       Marland Kitchen levothyroxine (SYNTHROID, LEVOTHROID) 25 MCG tablet Take 25 mcg by mouth daily before breakfast.      . norethindrone-ethinyl estradiol (JUNEL FE,GILDESS FE,LOESTRIN FE) 1-20 MG-MCG tablet Take 1 tablet by mouth daily.  1 Package  5  . OXYCONTIN 30 MG T12A  Take 30 mg by mouth 2 (two) times daily as needed (pain).       Marland Kitchen PROAIR HFA 108 (90 BASE) MCG/ACT inhaler as needed.       . ondansetron (ZOFRAN-ODT) 4 MG disintegrating tablet Take 4 mg by mouth 3 (three) times daily as needed for nausea or vomiting.       Marland Kitchen oxyCODONE-acetaminophen (PERCOCET/ROXICET) 5-325 MG per tablet Take 2 tablets by mouth every 4 (four) hours as needed.  6 tablet  0  . QUEtiapine (SEROQUEL) 50 MG tablet Take 50 mg by mouth at bedtime.      Marland Kitchen tiZANidine (ZANAFLEX) 4 MG capsule Take 4 mg by mouth as needed for muscle spasms.       Marland Kitchen zolpidem (AMBIEN CR) 12.5 MG CR tablet Take 12.5 mg by mouth at bedtime.       No current facility-administered medications for this visit.     ROS:  Pertinent items are noted in HPI.  SOCIAL HISTORY:  Married.   PHYSICAL EXAMINATION:    BP 120/68  Pulse 80  Ht 5' 2.75" (1.594 m)  Wt 162 lb (73.483 kg)  BMI 28.92 kg/m2  LMP 06/11/2013   Wt Readings from Last 3 Encounters:  06/18/13 162 lb (73.483 kg)  05/10/13 158 lb (71.668 kg)  05/04/13 165 lb (74.844 kg)     Ht Readings from Last 3 Encounters:  06/18/13 5' 2.75" (1.594 m)  05/04/13 5' 2.75" (1.594 m)    General appearance: alert, cooperative and appears stated age Abdomen - umbilical incisions.  Soft, nontender, nondistended.  No HSM or organomegaly.  Neurologic: Grossly normal.  Appears agitated and in motion.   Pelvic: External genitalia:  no lesions              Urethra:  normal appearing urethra with no masses, tenderness or lesions              Bartholins and Skenes: normal                 Vagina: normal appearing vagina with normal color and discharge, no lesions, vaginal flow noted. First degree uterine prolapse with a tenaculum to anterior cervical lip.               Cervix: normal appearance                   Bimanual Exam:  Uterus:  uterus is normal size, shape, consistency and nontender  Adnexa: normal adnexa in size,  nontender and no masses                                        Assessment   Chronic abdominal/pelvic pain with recent acute exacerbation accompanied by abnormal uterine bleeding.  Status post Essure.  Nickel allergy.  Recurrent skin lesions treated with Dapsone.  Status post laparoscopic surgeries.  History of pancreatitis.  Chronic back pain and history of back surgeries.  Normal colonoscopy.   Plan   We discussed laparoscopically assisted vaginal hysterectomy with bilateral salpingectomy versus hysterectomy with bilateral salpingectomy, possible bilateral salpingo-oophorectomy.   I reviewed benefits and risks of the procedure - bleeding, infection, damage to surrounding organs, DVT, PE, death, reaction to anesthesia, pneumonia, need for reoperation, need for hormone therapy after ovarian removal, possible continued pain following hysterectomy but absent menstruation.    Patient would like to precert procedure and then make a final decision.   In the meantime, she will continue with her birth control pills.   Return prn.    An After Visit Summary was printed and given to the patient.  50 minutes face to face time of which over 50% was spent in counseling.

## 2013-06-18 NOTE — Patient Instructions (Addendum)
We will call you to discuss precerting a hysterectomy procedure.  Laparoscopically Assisted Vaginal Hysterectomy, Care After Refer to this sheet in the next few weeks. These instructions provide you with information on caring for yourself after your procedure. Your health care provider may also give you more specific instructions. Your treatment has been planned according to current medical practices, but problems sometimes occur. Call your health care provider if you have any problems or questions after your procedure. WHAT TO EXPECT AFTER THE PROCEDURE After your procedure, it is typical to have the following:  Abdominal pain. You will be given pain medicine to control it.  Sore throat from the breathing tube that was inserted during surgery. HOME CARE INSTRUCTIONS  Only take over-the-counter or prescription medicines for pain, discomfort, or fever as directed by your health care provider.  Do not take aspirin. It can cause bleeding.  Do not drive when taking pain medicine.  Follow your health care provider's advice regarding diet, exercise, lifting, driving, and general activities.  Resume your usual diet as directed and allowed.  Get plenty of rest and sleep.  Do not douche, use tampons, or have sexual intercourse for at least 6 weeks, or until your health care provider gives you permission.  Change your bandages (dressings) as directed by your health care provider.  Monitor your temperature and notify your health care provider of a fever.  Take showers instead of baths for 2 3 weeks.  Do not drink alcohol until your health care provider gives you permission.  If you develop constipation, you may take a mild laxative with your health care provider's permission. Bran foods may help with constipation problems. Drinking enough fluids to keep your urine clear or pale yellow may help as well.  Try to have someone home with you for 1 2 weeks to help around the house.  Keep all of  your follow-up appointments as directed by your health care provider. SEEK MEDICAL CARE IF:   You have swelling, redness, or increasing pain around your incision sites.  You have pus coming from your incision.  You notice a bad smell coming from your incision.  Your incision breaks open.  You feel dizzy or lightheaded.  You have pain or bleeding when you urinate.  You have persistent diarrhea.  You have persistent nausea and vomiting.  You have abnormal vaginal discharge.  You have a rash.  You have any type of abnormal reaction or develop an allergy to your medicine.  You have poor pain control with your prescribed medicine. SEEK IMMEDIATE MEDICAL CARE IF:   You have a fever.  You have severe abdominal pain.  You have chest pain.  You have shortness of breath.  You faint.  You have pain, swelling, or redness in your leg.  You have heavy vaginal bleeding with blood clots. Document Released: 03/25/2011 Document Revised: 12/06/2012 Document Reviewed: 10/19/2012 [facility] Patient Information 2014 [place], [place].

## 2013-06-21 ENCOUNTER — Telehealth: Payer: Self-pay | Admitting: Obstetrics and Gynecology

## 2013-06-21 NOTE — Telephone Encounter (Signed)
Telephoned patient. Advised that quoted patient liability for surgery (surgeon charges) will be $549.85. Patient agreeable and will wait to hear from [facility] for scheduling.

## 2013-06-30 ENCOUNTER — Emergency Department (HOSPITAL_COMMUNITY): Payer: 59

## 2013-06-30 ENCOUNTER — Encounter (HOSPITAL_COMMUNITY): Payer: Self-pay | Admitting: Emergency Medicine

## 2013-06-30 ENCOUNTER — Emergency Department (HOSPITAL_COMMUNITY)
Admission: EM | Admit: 2013-06-30 | Discharge: 2013-07-01 | Disposition: A | Discharge status: Home / Self Care | Payer: 59 | Attending: Emergency Medicine | Admitting: Emergency Medicine

## 2013-06-30 DIAGNOSIS — Z79899 Other long term (current) drug therapy: Secondary | ICD-10-CM | POA: Insufficient documentation

## 2013-06-30 DIAGNOSIS — Z91040 Latex allergy status: Secondary | ICD-10-CM | POA: Insufficient documentation

## 2013-06-30 DIAGNOSIS — Z792 Long term (current) use of antibiotics: Secondary | ICD-10-CM | POA: Insufficient documentation

## 2013-06-30 DIAGNOSIS — Z87442 Personal history of urinary calculi: Secondary | ICD-10-CM | POA: Insufficient documentation

## 2013-06-30 DIAGNOSIS — Z87448 Personal history of other diseases of urinary system: Secondary | ICD-10-CM | POA: Insufficient documentation

## 2013-06-30 DIAGNOSIS — R109 Unspecified abdominal pain: Secondary | ICD-10-CM | POA: Insufficient documentation

## 2013-06-30 DIAGNOSIS — Z8639 Personal history of other endocrine, nutritional and metabolic disease: Secondary | ICD-10-CM | POA: Insufficient documentation

## 2013-06-30 DIAGNOSIS — F3289 Other specified depressive episodes: Secondary | ICD-10-CM | POA: Insufficient documentation

## 2013-06-30 DIAGNOSIS — Z8742 Personal history of other diseases of the female genital tract: Secondary | ICD-10-CM | POA: Insufficient documentation

## 2013-06-30 DIAGNOSIS — K625 Hemorrhage of anus and rectum: Secondary | ICD-10-CM

## 2013-06-30 DIAGNOSIS — Z862 Personal history of diseases of the blood and blood-forming organs and certain disorders involving the immune mechanism: Secondary | ICD-10-CM | POA: Insufficient documentation

## 2013-06-30 DIAGNOSIS — F411 Generalized anxiety disorder: Secondary | ICD-10-CM | POA: Insufficient documentation

## 2013-06-30 DIAGNOSIS — Z8619 Personal history of other infectious and parasitic diseases: Secondary | ICD-10-CM | POA: Insufficient documentation

## 2013-06-30 DIAGNOSIS — F329 Major depressive disorder, single episode, unspecified: Secondary | ICD-10-CM | POA: Insufficient documentation

## 2013-06-30 DIAGNOSIS — Z872 Personal history of diseases of the skin and subcutaneous tissue: Secondary | ICD-10-CM | POA: Insufficient documentation

## 2013-06-30 LAB — COMPREHENSIVE METABOLIC PANEL
ALBUMIN: 3.7 g/dL (ref 3.5–5.2)
ALK PHOS: 74 U/L (ref 39–117)
ALT: 18 U/L (ref 0–35)
AST: 50 U/L — AB (ref 0–37)
BUN: 10 mg/dL (ref 6–23)
CHLORIDE: 102 meq/L (ref 96–112)
CO2: 23 mEq/L (ref 19–32)
Calcium: 9.4 mg/dL (ref 8.4–10.5)
Creatinine, Ser: 0.62 mg/dL (ref 0.50–1.10)
GFR calc Af Amer: >90 mL/min (ref >90)
GFR calc non Af Amer: >90 mL/min (ref >90)
Glucose, Bld: 97 mg/dL (ref 70–99)
Potassium: 4.8 mEq/L (ref 3.7–5.3)
Sodium: 140 mEq/L (ref 137–147)
TOTAL PROTEIN: 7.5 g/dL (ref 6.0–8.3)
Total Bilirubin: <0.2 mg/dL — ABNORMAL LOW (ref 0.3–1.2)

## 2013-06-30 LAB — CBC WITH DIFFERENTIAL/PLATELET
BASOS PCT: 1 % (ref 0–1)
Basophils Absolute: 0.1 10*3/uL (ref 0.0–0.1)
Eosinophils Absolute: 0.4 10*3/uL (ref 0.0–0.7)
Eosinophils Relative: 5 % (ref 0–5)
HCT: 39 % (ref 36.0–46.0)
Hemoglobin: 13.2 g/dL (ref 12.0–15.0)
Lymphocytes Relative: 27 % (ref 12–46)
Lymphs Abs: 2.6 10*3/uL (ref 0.7–4.0)
MCH: 30.7 pg (ref 26.0–34.0)
MCHC: 33.8 g/dL (ref 30.0–36.0)
MCV: 90.7 fL (ref 78.0–100.0)
Monocytes Absolute: 0.7 10*3/uL (ref 0.1–1.0)
Monocytes Relative: 7 % (ref 3–12)
NEUTROS ABS: 5.9 10*3/uL (ref 1.7–7.7)
NEUTROS PCT: 60 % (ref 43–77)
Platelets: 421 10*3/uL — ABNORMAL HIGH (ref 150–400)
RBC: 4.3 MIL/uL (ref 3.87–5.11)
RDW: 13.4 % (ref 11.5–15.5)
WBC: 9.7 10*3/uL (ref 4.0–10.5)

## 2013-06-30 LAB — PROTIME-INR
INR: 0.99 (ref 0.00–1.49)
PROTHROMBIN TIME: 12.9 s (ref 11.6–15.2)

## 2013-06-30 LAB — POC OCCULT BLOOD, ED: FECAL OCCULT BLD: POSITIVE — AB

## 2013-06-30 LAB — LIPASE, BLOOD: Lipase: 28 U/L (ref 11–59)

## 2013-06-30 MED ORDER — LORAZEPAM 1 MG PO TABS
1.0000 mg | ORAL_TABLET | Freq: Once | ORAL | Status: AC
  Administered 2013-06-30: 1 mg via ORAL
  Filled 2013-06-30: qty 1

## 2013-06-30 MED ORDER — IOHEXOL 300 MG/ML  SOLN
100.0000 mL | Freq: Once | INTRAMUSCULAR | Status: AC | PRN
  Administered 2013-06-30: 100 mL via INTRAVENOUS

## 2013-06-30 NOTE — ED Notes (Signed)
Pt is very anxious, fidgety, shaking and not making eye contact. States, "I think I have an intestinal parasite, I have abdominal cramping and explosive diarrhea with blood in it and tissue. I have these lesions on my skin and I was at [facility] and its strange and it has been going on since August. I just keep smelling rubber"

## 2013-06-30 NOTE — ED Provider Notes (Addendum)
CSN: [id number]     Arrival date & time 06/30/13  2103 History   First MD Initiated Contact with Patient 06/30/13 2126     Chief Complaint  Patient presents with  . Rectal Bleeding     (Consider location/radiation/quality/duration/timing/severity/associated sxs/prior Treatment) HPI Comments: 3 episodes of bloody bowel movements today. First episode since normal colonoscopy 1 month ago by Dr. Collene Mares. No syncope, no dizziness, no CP, no SOB. Concerned for intestinal parasite due to hx of eosinophilia and she thought she saw something moving in her stools.  Patient is a 43 y.o. female presenting with hematochezia. The history is provided by the patient.  Rectal Bleeding Quality:  Bright red Amount:  Moderate Timing:  Intermittent Progression:  Unchanged Chronicity:  Recurrent Context: defecation   Context: not constipation, not diarrhea, not foreign body, not hemorrhoids, not rectal injury and not spontaneously   Similar prior episodes: yes   Relieved by:  Nothing Worsened by:  Nothing tried Ineffective treatments:  None tried Associated symptoms: no abdominal pain and no fever     Past Medical History  Diagnosis Date  . Kidney stones   . Skin disorder     lesion on forehead at eyebrow  . Pancreatitis   . Sphincter of Oddi dysfunction   . Ovarian cyst   . Sinus disease   . Thyroid disease   . Anemia   . Anxiety   . Blood disorder 2009  . Depression   . Dysmenorrhea   . Fibroid   . Scoliosis   . STD (sexually transmitted disease) [place]  . Urinary incontinence    Past Surgical History  Procedure Laterality Date  . Cholecystectomy    . Back surgery    . Bile duct exploration  2006 2008  . Umbilical hernia repair  2013  . 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 Dilation and curettage of uterus    . Essure tubal ligation  2007   Family History  Problem Relation Age of Onset  . Hyperlipidemia Mother   . Hypertension Father   . Breast cancer Paternal  Grandmother    History  Substance Use Topics  . Smoking status: Never Smoker   . Smokeless tobacco: Never Used  . Alcohol Use: 0.5 oz/week    1 drink(s) per week     Comment: occ wine   OB History   Grav Para Term Preterm Abortions TAB SAB Ect Mult Living   5 1   4 3 1   1      Review of Systems  Constitutional: Negative for fever.  Respiratory: Negative for cough, choking and shortness of breath.   Gastrointestinal: Positive for hematochezia. Negative for abdominal pain.  All other systems reviewed and are negative.      Allergies  Latex; Nickel; and Ciprofloxacin hcl  Home Medications   Current Outpatient Rx  Name  Route  Sig  Dispense  Refill  . ACZONE 5 % topical gel   Topical   Apply 1 application topically 2 (two) times daily.         Marland Kitchen albuterol (PROVENTIL HFA;VENTOLIN HFA) 108 (90 BASE) MCG/ACT inhaler   Inhalation   Inhale 2 puffs into the lungs every 6 (six) hours as needed for wheezing or shortness of breath.         . ALPRAZolam (XANAX) 0.5 MG tablet   Oral   Take 0.5 mg by mouth 2 (two) times daily.          Marland Kitchen  amphetamine-dextroamphetamine (ADDERALL XR) 25 MG 24 hr capsule   Oral   Take 25 mg by mouth every morning.         . fexofenadine-pseudoephedrine (ALLEGRA-D) 60-120 MG per tablet   Oral   Take 1 tablet by mouth every morning.          Marland Kitchen FLUoxetine (PROZAC) 40 MG capsule   Oral   Take 40 mg by mouth every morning.          Marland Kitchen levofloxacin (LEVAQUIN) 500 MG tablet   Oral   Take 500 mg by mouth daily.         . norethindrone-ethinyl estradiol (JUNEL FE,GILDESS FE,LOESTRIN FE) 1-20 MG-MCG tablet   Oral   Take 1 tablet by mouth daily.   1 Package   5   . ondansetron (ZOFRAN-ODT) 4 MG disintegrating tablet   Oral   Take 4 mg by mouth 3 (three) times daily as needed for nausea or vomiting.          Marland Kitchen oxyCODONE-acetaminophen (PERCOCET/ROXICET) 5-325 MG per tablet   Oral   Take 2 tablets by mouth every 4 (four) hours as  needed.   6 tablet   0   . OXYCONTIN 30 MG T12A   Oral   Take 30 mg by mouth 3 (three) times daily.          Marland Kitchen zolpidem (AMBIEN CR) 12.5 MG CR tablet   Oral   Take 12.5 mg by mouth at bedtime.          BP 143/101  Pulse 102  Temp(Src) 98.3 F (36.8 C) (Oral)  Resp 22  SpO2 97%  LMP 06/28/2013 Physical Exam  Nursing note and vitals reviewed. Constitutional: She is oriented to person, place, and time. She appears well-developed and well-nourished. No distress.  HENT:  Head: Normocephalic and atraumatic.  Eyes: EOM are normal. Pupils are equal, round, and reactive to light.  Neck: Normal range of motion. Neck supple.  Cardiovascular: Normal rate and regular rhythm.  Exam reveals no friction rub.   No murmur heard. Pulmonary/Chest: Effort normal and breath sounds normal. No respiratory distress. She has no wheezes. She has no rales.  Abdominal: Soft. She exhibits no distension. There is no tenderness. There is no rebound.  Genitourinary: Guaiac positive stool (trace positive, no gross blood).  Musculoskeletal: Normal range of motion. She exhibits no edema.  Neurological: She is alert and oriented to person, place, and time.  Skin: She is not diaphoretic.    ED Course  Procedures (including critical care time) Labs Review Labs Reviewed  CBC WITH DIFFERENTIAL - Abnormal; Notable for the following:    Platelets 421 (*)    All other components within normal limits  COMPREHENSIVE METABOLIC PANEL - Abnormal; Notable for the following:    AST 50 (*)    Total Bilirubin <0.2 (*)    All other components within normal limits  POC OCCULT BLOOD, ED - Abnormal; Notable for the following:    Fecal Occult Bld POSITIVE (*)    All other components within normal limits  OVA AND PARASITE EXAMINATION  LIPASE, BLOOD  PROTIME-INR  URINALYSIS, ROUTINE W REFLEX MICROSCOPIC   Imaging Review Ct Abdomen Pelvis W Contrast  07/01/2013   CLINICAL DATA:  Rectal bleeding, abdominal pain,  nausea, parasites in stool per patient.  EXAM: CT ABDOMEN AND PELVIS WITH CONTRAST  TECHNIQUE: Multidetector CT imaging of the abdomen and pelvis was performed using the standard protocol following bolus administration of intravenous contrast.  CONTRAST:  141mL OMNIPAQUE IOHEXOL 300 MG/ML  SOLN  COMPARISON:  [place] SONOHYSTEROGRAM dated 05/10/2013; CT ABD/PELVIS W CM dated 04/04/2013  FINDINGS: Included view of the lung bases are clear. Visualized heart and pericardium are unremarkable.  The liver, spleen, pancreas and adrenal glands are unremarkable. Status post cholecystectomy.  The stomach, small and large bowel are normal in course and caliber without inflammatory changes. Normal appendix. No intraperitoneal free fluid nor free air.  Kidneys are orthotopic, demonstrating symmetric enhancement without nephrolithiasis, hydronephrosis or renal masses. The unopacified ureters are normal in course and caliber. Delayed imaging through the kidneys demonstrates symmetric prompt excretion to the proximal urinary collecting system. Urinary bladder is decompressed and unremarkable.  Great vessels are normal in course and caliber. No lymphadenopathy by CT size criteria. Internal reproductive organs are nonsuspicious, apparent tubal ligation clips. Right adnexal cyst was 3 cm, now 10 mm . Levoscoliosis, L4-5 PLIF. Tiny fat containing umbilical hernia.  IMPRESSION: No acute intra-abdominal or pelvic process.  Status post cholecystectomy.   Electronically Signed   By: Elon Alas   On: 07/01/2013 00:28     EKG Interpretation None      MDM   Final diagnoses:  Rectal bleeding    64F here with rectal bleeding. 3 episodes today, looked like blood clots. Also concerned for parasites. Had colonoscopy 1 month ago by Dr. Collene Mares, she states was normal. Some mild abdominal pain with nausea, no vomiting.  Here on exam has central abdominal pain, LLQ pain. No gross blood on exam, but is hemoccult positive. Will scan, check  labs. With recent normal colonoscopy, if all negative, patient stable for discharge and can f/u with GI this upcoming week. Denies urinary symptoms, doesn't want to wait for UA since CT normal. Instructed to f/u with Dr. Collene Mares.  I have reviewed all labs and imaging and considered them in my medical decision making.    Osvaldo Shipper, MD 07/01/13 [place], MD 07/01/13 [phone]

## 2013-06-30 NOTE — ED Notes (Signed)
Had colonoscopy on Feb 25 and was told it was "normal".

## 2013-06-30 NOTE — ED Notes (Signed)
C/o vertigo x 1 week. Pt was seen at urgent care last week and given abx for cellulits on right arm.  Pt has history of same.  Pt is very anxious and worried that she has a parazsite. States she started bleeding from rectum today states blood is bright red and also c/o explosive diarrhea

## 2013-07-01 LAB — URINALYSIS, ROUTINE W REFLEX MICROSCOPIC
BILIRUBIN URINE: NEGATIVE
GLUCOSE, UA: NEGATIVE mg/dL
Ketones, ur: NEGATIVE mg/dL
Leukocytes, UA: NEGATIVE
NITRITE: NEGATIVE
PH: 5 (ref 5.0–8.0)
Protein, ur: NEGATIVE mg/dL
SPECIFIC GRAVITY, URINE: 1.029 (ref 1.005–1.030)
Urobilinogen, UA: 0.2 mg/dL (ref 0.0–1.0)

## 2013-07-01 LAB — URINE MICROSCOPIC-ADD ON

## 2013-07-01 NOTE — ED Notes (Signed)
All belongings, including black bag, cellphone and purse sent home with pt.

## 2013-07-01 NOTE — ED Notes (Signed)
Pt currently alert and oriented. No cardiac or respiratory distress. Pt stated that she is concerned that she has parasites in her stools. Currently no pain. Will continue to monitor.

## 2013-07-01 NOTE — Discharge Instructions (Signed)

## 2013-07-03 LAB — OVA AND PARASITE EXAMINATION: OVA AND PARASITES: NONE SEEN

## 2013-07-06 ENCOUNTER — Telehealth: Payer: Self-pay | Admitting: Obstetrics and Gynecology

## 2013-07-06 NOTE — Telephone Encounter (Signed)
Patient is calling to figure out about scheduling surgery

## 2013-07-06 NOTE — Telephone Encounter (Signed)
Return calal, LMTCB. Surgery is scheduled for 07-31-13.

## 2013-07-10 NOTE — Telephone Encounter (Signed)
Call to patient, Mailbox full and cant leave message.

## 2013-07-10 NOTE — Telephone Encounter (Signed)
Allergy visit prior to surgery is not necessary from my standpoint.  Thanks.

## 2013-07-10 NOTE — Telephone Encounter (Signed)
Spoke with pt about surgery date, and scheduled pre-op and post-op appts. Instructed pt per pre-op sheet. Pt had a question for Dr. Quincy Simmonds. Does she definitely want her to see an allergist about her possible allergy to nickel prior to surgery?

## 2013-07-12 NOTE — Telephone Encounter (Signed)
Patient calls back, advised per Dr Quincy Simmonds instruction, does not have to complete allergy appointment prior to surgery.

## 2013-07-12 NOTE — Telephone Encounter (Signed)
Call to patient, VM box full and unable to leave message.

## 2013-07-16 ENCOUNTER — Telehealth: Payer: Self-pay | Admitting: Obstetrics and Gynecology

## 2013-07-16 ENCOUNTER — Ambulatory Visit: Payer: 59 | Admitting: Obstetrics and Gynecology

## 2013-07-16 ENCOUNTER — Encounter: Payer: Self-pay | Admitting: Obstetrics and Gynecology

## 2013-07-16 NOTE — Telephone Encounter (Signed)
Please send a letter to the patient to reschedule her pre-op appointment.

## 2013-07-16 NOTE — Telephone Encounter (Signed)
Patient [facility] preop appt today. Called to get appt rescheduled patient did not answer and the mail box is full so i was unable to reach her to reschedule.

## 2013-07-16 NOTE — Telephone Encounter (Signed)
Call to patient, Mailbox full and cannot receive messages.

## 2013-07-17 NOTE — Telephone Encounter (Signed)
Call to patient, [facility] regarding appointment from yesterday. ROI indicates message may be left at this number and VM confirms "Mariah Reyes".

## 2013-07-19 ENCOUNTER — Ambulatory Visit: Payer: 59 | Admitting: Obstetrics and Gynecology

## 2013-07-19 NOTE — Telephone Encounter (Signed)
Thank you for the appropriate advice. I will see the patient on Monday.  I will close the encounter.

## 2013-07-19 NOTE — Telephone Encounter (Signed)
Patient presented at office today 25 minutes late for a pre-op consult. Per Dr. Quincy Simmonds, we rescheduled this patient to Monday, 07/23/13 at 9 AM. The patient has a serious type of rash on her face and wants to know if this will affect her surgery in any way? She is scheduled with a dermatologist tomorrow but requests a nurse from our office call her to speak with her today.

## 2013-07-19 NOTE — Telephone Encounter (Signed)
Spoke with patient. Patient states that she is concerned because she has a rash on her face that has been there for a couple of weeks. States that the rash is getting worse and "I think I may need to be placed on antibiotics." Patient has appointment tomorrow to see dermatologist. Patient has had rash one time before and was prescribed cream which got rid of the rash. Patient would like to know if this will affect her surgery date. Patient has office visit on 4/6 at 9:00 with Dr.Silva. Advised that as long as she goes to the dermatologist and they prescribe medication that it should be clear in time for surgery since surgery is scheduled for 4/14 and medication has previously gotten rid of rash. Advised she should speak with dermatologist and bring all medications prescribed for the rash to her appointment on Monday so Dr.Silva can take a look at everything. Patient is agreeable and verbalizes understanding.   Routing to provider for final review. Patient agreeable to disposition. Will close encounter

## 2013-07-23 ENCOUNTER — Encounter: Payer: Self-pay | Admitting: Obstetrics and Gynecology

## 2013-07-23 ENCOUNTER — Ambulatory Visit: Payer: 59 | Admitting: Obstetrics and Gynecology

## 2013-07-23 ENCOUNTER — Telehealth: Payer: Self-pay | Admitting: Obstetrics and Gynecology

## 2013-07-23 NOTE — Telephone Encounter (Signed)
Call to Patient. Advised that due to her multiple missed appointments for such an important consult to discuss major surgery, need to cancel her case.  We are not able to proceed with surgery as scheduled.  Explained that there is clearly a disconnect regarding commitment level for surgerical procedure.  Due to compromised relationship, we will be discharging from practice.  We will send certified letter and options for future care. Available for emergency care for 30 days.  ROI to be included in letter.  Patient teary and expresses frustration that she just got in late and wasn't feeling well due to skin lesions on face.  Advised that perhaps this was another indication that she is not ready for another physical stress to her body until she feels better.   Again, restates need to cancel surgery and discharge from practice and wished her well.  Leeroy Bock, front office supervisor present for call.  Hospital notified case is canceled.  Routing to provider for final review. Patient agreeable to disposition. Will close encounter

## 2013-07-23 NOTE — Telephone Encounter (Signed)
Pt's flight got in late night and she is not able to come in this morning for pre-op appointment. Offered patient another day but this is not convenient for her no available appts can you please schedule

## 2013-07-23 NOTE — Telephone Encounter (Signed)
Certified letter mailed.

## 2013-07-23 NOTE — Telephone Encounter (Signed)
Gay Filler,  Please cancel surgery as we just discussed. I will unfortunately need to discharge the patient from the practice.  Multiple "failed" pre-op appointments.

## 2013-07-23 NOTE — Telephone Encounter (Signed)
Thank you for reaching the patient.

## 2013-07-25 ENCOUNTER — Emergency Department (HOSPITAL_COMMUNITY)
Admission: EM | Admit: 2013-07-25 | Discharge: 2013-07-26 | Disposition: A | Discharge status: Other Acute Inpatient Hospital | Payer: 59 | Attending: Emergency Medicine | Admitting: Emergency Medicine

## 2013-07-25 ENCOUNTER — Encounter (HOSPITAL_COMMUNITY): Payer: Self-pay | Admitting: Emergency Medicine

## 2013-07-25 DIAGNOSIS — Z87442 Personal history of urinary calculi: Secondary | ICD-10-CM | POA: Insufficient documentation

## 2013-07-25 DIAGNOSIS — F1110 Opioid abuse, uncomplicated: Secondary | ICD-10-CM | POA: Insufficient documentation

## 2013-07-25 DIAGNOSIS — Z91040 Latex allergy status: Secondary | ICD-10-CM | POA: Insufficient documentation

## 2013-07-25 DIAGNOSIS — Z8619 Personal history of other infectious and parasitic diseases: Secondary | ICD-10-CM | POA: Insufficient documentation

## 2013-07-25 DIAGNOSIS — F1310 Sedative, hypnotic or anxiolytic abuse, uncomplicated: Secondary | ICD-10-CM | POA: Insufficient documentation

## 2013-07-25 DIAGNOSIS — F329 Major depressive disorder, single episode, unspecified: Secondary | ICD-10-CM | POA: Insufficient documentation

## 2013-07-25 DIAGNOSIS — F322 Major depressive disorder, single episode, severe without psychotic features: Secondary | ICD-10-CM | POA: Diagnosis present

## 2013-07-25 DIAGNOSIS — Z79899 Other long term (current) drug therapy: Secondary | ICD-10-CM | POA: Insufficient documentation

## 2013-07-25 DIAGNOSIS — F1510 Other stimulant abuse, uncomplicated: Secondary | ICD-10-CM | POA: Insufficient documentation

## 2013-07-25 DIAGNOSIS — Z8719 Personal history of other diseases of the digestive system: Secondary | ICD-10-CM | POA: Insufficient documentation

## 2013-07-25 DIAGNOSIS — R197 Diarrhea, unspecified: Secondary | ICD-10-CM | POA: Insufficient documentation

## 2013-07-25 DIAGNOSIS — Z8739 Personal history of other diseases of the musculoskeletal system and connective tissue: Secondary | ICD-10-CM | POA: Insufficient documentation

## 2013-07-25 DIAGNOSIS — E079 Disorder of thyroid, unspecified: Secondary | ICD-10-CM | POA: Insufficient documentation

## 2013-07-25 DIAGNOSIS — Z8742 Personal history of other diseases of the female genital tract: Secondary | ICD-10-CM | POA: Insufficient documentation

## 2013-07-25 DIAGNOSIS — Z862 Personal history of diseases of the blood and blood-forming organs and certain disorders involving the immune mechanism: Secondary | ICD-10-CM | POA: Insufficient documentation

## 2013-07-25 DIAGNOSIS — R61 Generalized hyperhidrosis: Secondary | ICD-10-CM | POA: Insufficient documentation

## 2013-07-25 DIAGNOSIS — R45851 Suicidal ideations: Secondary | ICD-10-CM | POA: Insufficient documentation

## 2013-07-25 DIAGNOSIS — Z792 Long term (current) use of antibiotics: Secondary | ICD-10-CM | POA: Insufficient documentation

## 2013-07-25 DIAGNOSIS — F3289 Other specified depressive episodes: Secondary | ICD-10-CM | POA: Insufficient documentation

## 2013-07-25 DIAGNOSIS — L089 Local infection of the skin and subcutaneous tissue, unspecified: Secondary | ICD-10-CM | POA: Insufficient documentation

## 2013-07-25 DIAGNOSIS — F411 Generalized anxiety disorder: Secondary | ICD-10-CM | POA: Insufficient documentation

## 2013-07-25 DIAGNOSIS — F32A Depression, unspecified: Secondary | ICD-10-CM

## 2013-07-25 DIAGNOSIS — B958 Unspecified staphylococcus as the cause of diseases classified elsewhere: Secondary | ICD-10-CM | POA: Diagnosis present

## 2013-07-25 LAB — CBC WITH DIFFERENTIAL/PLATELET
Basophils Absolute: 0.1 10*3/uL (ref 0.0–0.1)
Basophils Relative: 1 % (ref 0–1)
Eosinophils Absolute: 0.3 10*3/uL (ref 0.0–0.7)
Eosinophils Relative: 3 % (ref 0–5)
HCT: 38.8 % (ref 36.0–46.0)
HEMOGLOBIN: 13.2 g/dL (ref 12.0–15.0)
LYMPHS ABS: 1.9 10*3/uL (ref 0.7–4.0)
Lymphocytes Relative: 23 % (ref 12–46)
MCH: 30.6 pg (ref 26.0–34.0)
MCHC: 34 g/dL (ref 30.0–36.0)
MCV: 89.8 fL (ref 78.0–100.0)
Monocytes Absolute: 0.5 10*3/uL (ref 0.1–1.0)
Monocytes Relative: 6 % (ref 3–12)
NEUTROS PCT: 68 % (ref 43–77)
Neutro Abs: 5.8 10*3/uL (ref 1.7–7.7)
Platelets: 363 10*3/uL (ref 150–400)
RBC: 4.32 MIL/uL (ref 3.87–5.11)
RDW: 12.9 % (ref 11.5–15.5)
WBC: 8.6 10*3/uL (ref 4.0–10.5)

## 2013-07-25 LAB — COMPREHENSIVE METABOLIC PANEL
ALT: 26 U/L (ref 0–35)
AST: 33 U/L (ref 0–37)
Albumin: 3.8 g/dL (ref 3.5–5.2)
Alkaline Phosphatase: 81 U/L (ref 39–117)
BUN: 12 mg/dL (ref 6–23)
CHLORIDE: 103 meq/L (ref 96–112)
CO2: 22 mEq/L (ref 19–32)
Calcium: 9.2 mg/dL (ref 8.4–10.5)
Creatinine, Ser: 0.62 mg/dL (ref 0.50–1.10)
GFR calc Af Amer: >90 mL/min (ref >90)
Glucose, Bld: 117 mg/dL — ABNORMAL HIGH (ref 70–99)
POTASSIUM: 3.9 meq/L (ref 3.7–5.3)
Sodium: 139 mEq/L (ref 137–147)
Total Bilirubin: 0.2 mg/dL — ABNORMAL LOW (ref 0.3–1.2)
Total Protein: 7.4 g/dL (ref 6.0–8.3)

## 2013-07-25 LAB — RAPID URINE DRUG SCREEN, HOSP PERFORMED
AMPHETAMINES: POSITIVE — AB
BENZODIAZEPINES: POSITIVE — AB
Barbiturates: NOT DETECTED
Cocaine: NOT DETECTED
Opiates: POSITIVE — AB
Tetrahydrocannabinol: NOT DETECTED

## 2013-07-25 LAB — LIPASE, BLOOD: LIPASE: 25 U/L (ref 11–59)

## 2013-07-25 LAB — ETHANOL: Alcohol, Ethyl (B): <11 mg/dL (ref 0–11)

## 2013-07-25 MED ORDER — HYDROXYZINE HCL 25 MG PO TABS
25.0000 mg | ORAL_TABLET | Freq: Once | ORAL | Status: AC
  Administered 2013-07-25: 25 mg via ORAL
  Filled 2013-07-25: qty 1

## 2013-07-25 NOTE — ED Provider Notes (Signed)
CSN: [id number]     Arrival date & time 07/25/13  1752 History  This chart was scribed for non-physician practitioner Hazel Sams, PA-C working with Jasper Riling. Alvino Chapel, MD by Rolanda Lundborg, ED Scribe. This patient was seen in room WTR2/WLPT2 and the patient's care was started at 8:09 PM.    Chief Complaint  Patient presents with  . Recurrent Skin Infections  . Suicidal   The history is provided by the patient. No language interpreter was used.   HPI Comments: Mariah Reyes is a 43 y.o. female who presents to the Emergency Department complaining of a staph infection on her face onset one month ago with facial swelling and multiple facial lesions. She reports three lesions, one being a month old and two being one week old. She states she was seen by a dermatologist in [place] and told that it was a rare case of staph. He gave antibiotics and a topical cream. She reports an itchy rash to the arms, legs, and back that she thinks is from the antibiotics.  Pt also reports diarrhea, diaphoresis, and abdominal pain for the last few days. She has a h/o C diff and states this feels the same.   She also reports suicidal ideations. She denies having a plan or self-injury. States she is tired of dealing with everything and sometimes feels she "just doesn't want to be here anymore". She reports a h/o suicide attempt when she took 5 sleeping pills. She has a psychiatrist but cannot remember her name. States she is on paroxetine and has been on it for years. She denies hallucinations or HI.   Past Medical History  Diagnosis Date  . Kidney stones   . Skin disorder     lesion on forehead at eyebrow  . Pancreatitis   . Sphincter of Oddi dysfunction   . Ovarian cyst   . Sinus disease   . Thyroid disease   . Anemia   . Anxiety   . Blood disorder 2009  . Depression   . Dysmenorrhea   . Fibroid   . Scoliosis   . STD (sexually transmitted disease) [place]  . Urinary incontinence    Past  Surgical History  Procedure Laterality Date  . Cholecystectomy    . Back surgery    . Bile duct exploration  2006 2008  . Umbilical hernia repair  2013  . 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 Dilation and curettage of uterus    . Essure tubal ligation  2007   Family History  Problem Relation Age of Onset  . Hyperlipidemia Mother   . Hypertension Father   . Breast cancer Paternal Grandmother    History  Substance Use Topics  . Smoking status: Never Smoker   . Smokeless tobacco: Never Used  . Alcohol Use: 0.5 oz/week    1 drink(s) per week     Comment: occ wine   OB History   Grav Para Term Preterm Abortions TAB SAB Ect Mult Living   5 1   4 3 1   1      Review of Systems  Constitutional: Positive for diaphoresis.  Gastrointestinal: Positive for abdominal pain and diarrhea.  Skin: Positive for rash.  Psychiatric/Behavioral: Positive for suicidal ideas. Negative for self-injury.  All other systems reviewed and are negative.     Allergies  Latex; Nickel; and Ciprofloxacin hcl  Home Medications   Current Outpatient Rx  Name  Route  Sig  Dispense  Refill  . ACZONE 5 % topical gel   Topical   Apply 1 application topically 2 (two) times daily.         Marland Kitchen albuterol (PROVENTIL HFA;VENTOLIN HFA) 108 (90 BASE) MCG/ACT inhaler   Inhalation   Inhale 2 puffs into the lungs every 6 (six) hours as needed for wheezing or shortness of breath.         . ALPRAZolam (XANAX) 0.5 MG tablet   Oral   Take 0.5 mg by mouth 2 (two) times daily.          Marland Kitchen amphetamine-dextroamphetamine (ADDERALL XR) 25 MG 24 hr capsule   Oral   Take 25 mg by mouth every morning.         . fexofenadine-pseudoephedrine (ALLEGRA-D) 60-120 MG per tablet   Oral   Take 1 tablet by mouth every morning.          Marland Kitchen FLUoxetine (PROZAC) 40 MG capsule   Oral   Take 40 mg by mouth every morning.          Marland Kitchen levofloxacin (LEVAQUIN) 500 MG tablet   Oral   Take 500 mg by mouth daily.          . norethindrone-ethinyl estradiol (JUNEL FE,GILDESS FE,LOESTRIN FE) 1-20 MG-MCG tablet   Oral   Take 1 tablet by mouth daily.   1 Package   5   . ondansetron (ZOFRAN-ODT) 4 MG disintegrating tablet   Oral   Take 4 mg by mouth 3 (three) times daily as needed for nausea or vomiting.          Marland Kitchen oxyCODONE-acetaminophen (PERCOCET/ROXICET) 5-325 MG per tablet   Oral   Take 2 tablets by mouth every 4 (four) hours as needed.   6 tablet   0   . OXYCONTIN 30 MG T12A   Oral   Take 30 mg by mouth 3 (three) times daily.          Marland Kitchen zolpidem (AMBIEN CR) 12.5 MG CR tablet   Oral   Take 12.5 mg by mouth at bedtime.          BP 125/83  Pulse 87  Temp(Src) 98.4 F (36.9 C) (Oral)  Resp 18  SpO2 97%  LMP 06/28/2013 Physical Exam  Nursing note and vitals reviewed. Constitutional: She is oriented to person, place, and time. She appears well-developed and well-nourished. No distress.  HENT:  Head: Normocephalic and atraumatic.  Mouth/Throat: Oropharynx is clear and moist.  Eyes: EOM are normal.  Neck: Neck supple. No tracheal deviation present.  Cardiovascular: Normal rate.   Pulmonary/Chest: Effort normal. No respiratory distress.  Musculoskeletal: Normal range of motion.  Neurological: She is alert and oriented to person, place, and time.  Skin: Skin is warm and dry. No rash noted.  Psychiatric: She has a normal mood and affect. Her behavior is normal.    ED Course  Procedures   DIAGNOSTIC STUDIES: Oxygen Saturation is 97% on RA, normal by my interpretation.    COORDINATION OF CARE: 9:12 PM- Discussed treatment plan with pt including TTS evaluation. Pt agrees to plan.  Laboratory tests unremarkable. Patient currently undergoing treatment for her skin condition. No signs of rash to the skin or allergic response to medications. At this time patient medically cleared and able to have further psychiatric evaluation.  Initial TTS evaluation recommends further inpatient  psychiatric evaluation.    Results for orders placed during the hospital encounter of 07/25/13  CBC WITH DIFFERENTIAL      Result  Value Ref Range   WBC 8.6  4.0 - 10.5 K/uL   RBC 4.32  3.87 - 5.11 MIL/uL   Hemoglobin 13.2  12.0 - 15.0 g/dL   HCT 38.8  36.0 - 46.0 %   MCV 89.8  78.0 - 100.0 fL   MCH 30.6  26.0 - 34.0 pg   MCHC 34.0  30.0 - 36.0 g/dL   RDW 12.9  11.5 - 15.5 %   Platelets 363  150 - 400 K/uL   Neutrophils Relative % 68  43 - 77 %   Neutro Abs 5.8  1.7 - 7.7 K/uL   Lymphocytes Relative 23  12 - 46 %   Lymphs Abs 1.9  0.7 - 4.0 K/uL   Monocytes Relative 6  3 - 12 %   Monocytes Absolute 0.5  0.1 - 1.0 K/uL   Eosinophils Relative 3  0 - 5 %   Eosinophils Absolute 0.3  0.0 - 0.7 K/uL   Basophils Relative 1  0 - 1 %   Basophils Absolute 0.1  0.0 - 0.1 K/uL  COMPREHENSIVE METABOLIC PANEL      Result Value Ref Range   Sodium 139  137 - 147 mEq/L   Potassium 3.9  3.7 - 5.3 mEq/L   Chloride 103  96 - 112 mEq/L   CO2 22  19 - 32 mEq/L   Glucose, Bld 117 (*) 70 - 99 mg/dL   BUN 12  6 - 23 mg/dL   Creatinine, Ser 0.62  0.50 - 1.10 mg/dL   Calcium 9.2  8.4 - 10.5 mg/dL   Total Protein 7.4  6.0 - 8.3 g/dL   Albumin 3.8  3.5 - 5.2 g/dL   AST 33  0 - 37 U/L   ALT 26  0 - 35 U/L   Alkaline Phosphatase 81  39 - 117 U/L   Total Bilirubin 0.2 (*) 0.3 - 1.2 mg/dL   GFR calc non Af Amer >90  >90 mL/min   GFR calc Af Amer >90  >90 mL/min  LIPASE, BLOOD      Result Value Ref Range   Lipase 25  11 - 59 U/L  ETHANOL      Result Value Ref Range   Alcohol, Ethyl (B) <11  0 - 11 mg/dL  URINE RAPID DRUG SCREEN (HOSP PERFORMED)      Result Value Ref Range   Opiates POSITIVE (*) NONE DETECTED   Cocaine NONE DETECTED  NONE DETECTED   Benzodiazepines POSITIVE (*) NONE DETECTED   Amphetamines POSITIVE (*) NONE DETECTED   Tetrahydrocannabinol NONE DETECTED  NONE DETECTED   Barbiturates NONE DETECTED  NONE DETECTED    MDM   Final diagnoses:  Depression  Suicidal ideation     I personally performed the services described in this documentation, which was scribed in my presence. The recorded information has been reviewed and is accurate.  Martie Lee, PA-C 07/26/13 [phone]

## 2013-07-25 NOTE — ED Notes (Signed)
PT was dx w/ highly contagious staph infection.  Has been on 4 abx in one month.  Now having diarrhea.  Hx of C.diff.  C/o suicidal ideations.  Denies plan.  Denies HI.

## 2013-07-26 ENCOUNTER — Encounter (HOSPITAL_COMMUNITY): Payer: Self-pay | Admitting: Registered Nurse

## 2013-07-26 ENCOUNTER — Encounter (HOSPITAL_COMMUNITY): Payer: Self-pay

## 2013-07-26 ENCOUNTER — Inpatient Hospital Stay (HOSPITAL_COMMUNITY)
Admission: AD | Admit: 2013-07-26 | Discharge: 2013-07-30 | DRG: 885 | Disposition: A | Discharge status: Home / Self Care | Payer: 59 | Source: Intra-hospital | Attending: Psychiatry | Admitting: Psychiatry

## 2013-07-26 DIAGNOSIS — B958 Unspecified staphylococcus as the cause of diseases classified elsewhere: Secondary | ICD-10-CM | POA: Diagnosis present

## 2013-07-26 DIAGNOSIS — F322 Major depressive disorder, single episode, severe without psychotic features: Principal | ICD-10-CM | POA: Diagnosis present

## 2013-07-26 DIAGNOSIS — F4322 Adjustment disorder with anxiety: Secondary | ICD-10-CM | POA: Diagnosis present

## 2013-07-26 DIAGNOSIS — F603 Borderline personality disorder: Secondary | ICD-10-CM | POA: Diagnosis present

## 2013-07-26 DIAGNOSIS — Z803 Family history of malignant neoplasm of breast: Secondary | ICD-10-CM

## 2013-07-26 DIAGNOSIS — Z87442 Personal history of urinary calculi: Secondary | ICD-10-CM

## 2013-07-26 DIAGNOSIS — R45851 Suicidal ideations: Secondary | ICD-10-CM

## 2013-07-26 DIAGNOSIS — K921 Melena: Secondary | ICD-10-CM

## 2013-07-26 DIAGNOSIS — Z8249 Family history of ischemic heart disease and other diseases of the circulatory system: Secondary | ICD-10-CM

## 2013-07-26 DIAGNOSIS — F4521 Hypochondriasis: Secondary | ICD-10-CM

## 2013-07-26 DIAGNOSIS — M4120 Other idiopathic scoliosis, site unspecified: Secondary | ICD-10-CM | POA: Diagnosis present

## 2013-07-26 DIAGNOSIS — F4325 Adjustment disorder with mixed disturbance of emotions and conduct: Secondary | ICD-10-CM | POA: Diagnosis present

## 2013-07-26 MED ORDER — ZOLPIDEM TARTRATE 5 MG PO TABS
5.0000 mg | ORAL_TABLET | Freq: Every evening | ORAL | Status: DC | PRN
Start: 2013-07-26 — End: 2013-07-26
  Administered 2013-07-26: 5 mg via ORAL
  Filled 2013-07-26: qty 1

## 2013-07-26 MED ORDER — MUPIROCIN 2 % EX OINT
1.0000 "application " | TOPICAL_OINTMENT | Freq: Two times a day (BID) | CUTANEOUS | Status: DC
  Administered 2013-07-27 – 2013-07-30 (×6): 1 via NASAL
  Filled 2013-07-26 (×2): qty 22

## 2013-07-26 MED ORDER — FLUOXETINE HCL 20 MG PO CAPS
40.0000 mg | ORAL_CAPSULE | Freq: Every morning | ORAL | Status: DC
  Administered 2013-07-27 – 2013-07-30 (×4): 40 mg via ORAL
  Filled 2013-07-26 (×7): qty 2
  Filled 2013-07-26: qty 6

## 2013-07-26 MED ORDER — LORATADINE 10 MG PO TABS
10.0000 mg | ORAL_TABLET | Freq: Every day | ORAL | Status: DC
  Administered 2013-07-27: 10 mg via ORAL
  Filled 2013-07-26 (×5): qty 1

## 2013-07-26 MED ORDER — ZOLPIDEM TARTRATE 5 MG PO TABS: 5.0000 mg | ORAL_TABLET | Freq: Every evening | ORAL | Status: DC | PRN

## 2013-07-26 MED ORDER — AMPHETAMINE-DEXTROAMPHET ER 20 MG PO CP24
20.0000 mg | ORAL_CAPSULE | Freq: Every day | ORAL | Status: DC
  Filled 2013-07-26: qty 1

## 2013-07-26 MED ORDER — NORETHIN ACE-ETH ESTRAD-FE 1-20 MG-MCG PO TABS: 1.0000 | ORAL_TABLET | Freq: Every day | ORAL | Status: DC

## 2013-07-26 MED ORDER — DAPSONE 5 % EX GEL: 1.0000 "application " | Freq: Two times a day (BID) | CUTANEOUS | Status: DC

## 2013-07-26 MED ORDER — LORATADINE 10 MG PO TABS
10.0000 mg | ORAL_TABLET | Freq: Every day | ORAL | Status: DC
  Administered 2013-07-26: 10 mg via ORAL
  Filled 2013-07-26: qty 1

## 2013-07-26 MED ORDER — MUPIROCIN 2 % EX OINT
1.0000 "application " | TOPICAL_OINTMENT | Freq: Two times a day (BID) | CUTANEOUS | Status: DC
  Administered 2013-07-26 (×2): 1 via NASAL
  Filled 2013-07-26: qty 22

## 2013-07-26 MED ORDER — CEPHALEXIN 500 MG PO CAPS
500.0000 mg | ORAL_CAPSULE | Freq: Three times a day (TID) | ORAL | Status: DC
  Administered 2013-07-27 – 2013-07-30 (×13): 500 mg via ORAL
  Filled 2013-07-26 (×11): qty 1
  Filled 2013-07-26: qty 2
  Filled 2013-07-26 (×11): qty 1

## 2013-07-26 MED ORDER — ALUM & MAG HYDROXIDE-SIMETH 200-200-20 MG/5ML PO SUSP: 30.0000 mL | ORAL | Status: DC | PRN

## 2013-07-26 MED ORDER — DIPHENHYDRAMINE HCL 25 MG PO CAPS
25.0000 mg | ORAL_CAPSULE | Freq: Once | ORAL | Status: AC
  Administered 2013-07-26: 25 mg via ORAL
  Filled 2013-07-26: qty 1

## 2013-07-26 MED ORDER — AMPHETAMINE-DEXTROAMPHET ER 10 MG PO CP24
20.0000 mg | ORAL_CAPSULE | Freq: Every day | ORAL | Status: DC
  Administered 2013-07-26 – 2013-07-27 (×2): 20 mg via ORAL
  Filled 2013-07-26 (×2): qty 2

## 2013-07-26 MED ORDER — ALBUTEROL SULFATE HFA 108 (90 BASE) MCG/ACT IN AERS: 2.0000 | INHALATION_SPRAY | Freq: Four times a day (QID) | RESPIRATORY_TRACT | Status: DC | PRN

## 2013-07-26 MED ORDER — LEVOTHYROXINE SODIUM 50 MCG PO TABS
50.0000 ug | ORAL_TABLET | Freq: Every day | ORAL | Status: DC
  Administered 2013-07-27 – 2013-07-30 (×4): 50 ug via ORAL
  Filled 2013-07-26 (×5): qty 1
  Filled 2013-07-26: qty 2
  Filled 2013-07-26: qty 1

## 2013-07-26 MED ORDER — ALPRAZOLAM 0.5 MG PO TABS
0.5000 mg | ORAL_TABLET | Freq: Two times a day (BID) | ORAL | Status: DC
  Administered 2013-07-26: 0.5 mg via ORAL
  Filled 2013-07-26: qty 1

## 2013-07-26 MED ORDER — ALBUTEROL SULFATE HFA 108 (90 BASE) MCG/ACT IN AERS
2.0000 | INHALATION_SPRAY | Freq: Four times a day (QID) | RESPIRATORY_TRACT | Status: DC | PRN
  Administered 2013-07-26 – 2013-07-29 (×4): 2 via RESPIRATORY_TRACT
  Filled 2013-07-26: qty 6.7

## 2013-07-26 MED ORDER — PANTOPRAZOLE SODIUM 40 MG PO TBEC
40.0000 mg | DELAYED_RELEASE_TABLET | Freq: Every day | ORAL | Status: DC
  Administered 2013-07-26: 40 mg via ORAL
  Filled 2013-07-26: qty 1

## 2013-07-26 MED ORDER — LEVOTHYROXINE SODIUM 50 MCG PO TABS
50.0000 ug | ORAL_TABLET | Freq: Every day | ORAL | Status: DC
  Administered 2013-07-26: 50 ug via ORAL
  Filled 2013-07-26 (×2): qty 1

## 2013-07-26 MED ORDER — MAGNESIUM HYDROXIDE 400 MG/5ML PO SUSP: 30.0000 mL | Freq: Every day | ORAL | Status: DC | PRN

## 2013-07-26 MED ORDER — CEPHALEXIN 500 MG PO CAPS
500.0000 mg | ORAL_CAPSULE | Freq: Two times a day (BID) | ORAL | Status: DC
  Administered 2013-07-26 (×2): 500 mg via ORAL
  Filled 2013-07-26 (×2): qty 1

## 2013-07-26 MED ORDER — ALPRAZOLAM 0.5 MG PO TABS
0.5000 mg | ORAL_TABLET | Freq: Two times a day (BID) | ORAL | Status: DC
  Administered 2013-07-26 – 2013-07-30 (×8): 0.5 mg via ORAL
  Filled 2013-07-26 (×9): qty 1

## 2013-07-26 MED ORDER — OXYCODONE HCL ER 15 MG PO T12A
30.0000 mg | EXTENDED_RELEASE_TABLET | Freq: Three times a day (TID) | ORAL | Status: DC
  Administered 2013-07-26: 30 mg via ORAL
  Filled 2013-07-26: qty 2

## 2013-07-26 MED ORDER — ACETAMINOPHEN 325 MG PO TABS
650.0000 mg | ORAL_TABLET | Freq: Four times a day (QID) | ORAL | Status: DC | PRN
  Administered 2013-07-27 (×2): 650 mg via ORAL
  Filled 2013-07-26 (×2): qty 2

## 2013-07-26 MED ORDER — FLUOXETINE HCL 20 MG PO CAPS
40.0000 mg | ORAL_CAPSULE | Freq: Every morning | ORAL | Status: DC
  Administered 2013-07-26: 40 mg via ORAL
  Filled 2013-07-26: qty 2

## 2013-07-26 MED ORDER — ZOLPIDEM TARTRATE 5 MG PO TABS
5.0000 mg | ORAL_TABLET | Freq: Every evening | ORAL | Status: DC | PRN
  Administered 2013-07-26 – 2013-07-29 (×4): 5 mg via ORAL
  Filled 2013-07-26 (×5): qty 1

## 2013-07-26 MED ORDER — OXYCODONE HCL 5 MG PO TABS
15.0000 mg | ORAL_TABLET | Freq: Three times a day (TID) | ORAL | Status: DC | PRN
  Administered 2013-07-26 – 2013-07-27 (×2): 15 mg via ORAL
  Filled 2013-07-26 (×2): qty 3

## 2013-07-26 MED ORDER — ALBUTEROL SULFATE (2.5 MG/3ML) 0.083% IN NEBU: 2.5000 mg | INHALATION_SOLUTION | Freq: Four times a day (QID) | RESPIRATORY_TRACT | Status: DC | PRN

## 2013-07-26 NOTE — ED Notes (Signed)
Pt. Belongings locked up in the TCU near the psych-ed [address]26. Pt. Has 1 belongings bag.

## 2013-07-26 NOTE — ED Notes (Signed)
Pt A&O x4. Steady gait upon ambulating. Patient requested copies of medical chart information. Aware that we are unable to provide this information and that it is on MyChart. Belongings with patient on transfer but no access to them before discharge. Transferred with all facial bandages intact and all wounds covered. Will monitor patient closely.

## 2013-07-26 NOTE — ED Notes (Signed)
Report given to Krista, RN

## 2013-07-26 NOTE — ED Notes (Signed)
Per Beverely Low at [facility] assessment, patient to be seen on first shift by psychiatry or assessment team member.

## 2013-07-26 NOTE — ED Notes (Signed)
Patient attempted to make outside phone call to retrieve her phone and purse. Charge nurse aware. Speaking with patient now to help patient retrieve her things.

## 2013-07-26 NOTE — Progress Notes (Signed)
Patient ID: Mariah Reyes, female   DOB: [birth year], 43 y.o.   MRN: [id number]  Pt very resistant to admission process, poor historian, very slow to respond to any questions, focused on making sure "I get my pain meds.", pt denies SI/HI/AVH, minimizing needing to be here states "I thought it was only for 24 hours, I never agreed to come here for more than 24 hours, the only reason I wanted to come here is because I didn't want to be around my son with this infection on my face."  Pt frequently tearful, upset that she can't leave, not cooperative for answering admission questions.  Oriented to unit and rules.

## 2013-07-26 NOTE — ED Notes (Signed)
Charge gave pt her cell phone to use to call someone to come get her car from the parking lot. Pt verbally gave consent for charge nurse and [employer] to go get 1 zebra print belonging bag from her care. Adela Lank, rn was witness to this verbal agreement.

## 2013-07-26 NOTE — ED Notes (Addendum)
[facility] called and notified of transportation to [facility].

## 2013-07-26 NOTE — Consult Note (Signed)
Face to face evaluation and I agree with this evaluation 

## 2013-07-26 NOTE — ED Provider Notes (Signed)
1:41 PM'Called to bedside to eval pt who was eating lunch and was complaining that her throat was closing. On further questioning, she appears to have developed globus sensation after eating [place]. She has had several other episodes of similar character last several weeks and had been scheduled for an EGD yesterday which she missed. She has been on Prilosec in the past for esophagitis. Will restart this. This does not appear to be anaphylactic reaction.     Neta Ehlers, MD 07/26/13 2005

## 2013-07-26 NOTE — Progress Notes (Signed)
Psychoeducational Group Note  Date:  07/26/2013 Time:  8:00 p.m.   Group Topic/Focus:  Wrap-Up Group:   The focus of this group is to help patients review their daily goal of treatment and discuss progress on daily workbooks.  Participation Level: Did Not Attend  Participation Quality:  Not Applicable  Affect:  Not Applicable  Cognitive:  Not Applicable  Insight:  Not Applicable  Engagement in Group: Not Applicable  Additional Comments:  The patient elected not to attend group due to her having bandages on her face.   Gennette Pac 07/26/2013, 10:52 PM

## 2013-07-26 NOTE — Consult Note (Signed)
  Patient presents to [facility] with complaints of depression, anxiety, and stomach pain.  Patient has been having chronic skin infection and has taking multiple antibiotics.  Patient states that she just feels tired and feels like a monster related to the most recent skin infection (staph) that is on her face.  Patient stats that she has had suicidal thoughts "I don't want to kill myself; but I am so tired of this; I feel like a monster where ever I go; people looking at you funny.  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 I have just thought about taking some pills and going to sleep and not waking up.  I don't want to be home cause I am afraid of putting my son at risk.  If I went to sleep and didn't wake up; I would do it while he was at school." Patient is unable to contract for safety and tearful.   Agree with the TTS assessment for inpatient treatment.  Patient has been accepted to [facility] 502/01.  Will monitor for safety and stabilization until transferred to [facility].  Tocara Mennen B. Blakelee Allington FNP-BC

## 2013-07-26 NOTE — Progress Notes (Signed)
Patient ID: Mariah Reyes, female   DOB: 1970-11-04, 43 y.o.   MRN: [id number]  Patient reported receiving 30 mg oxycodone 3 times per day. Writer contacted [facility] in [place] and verified that it was 30 mg oxycodone every eight hours. Last refill was March 25th 2015. Prescriber was Paralee Cancel

## 2013-07-26 NOTE — ED Notes (Signed)
Pt in bathroom dry heaving and stating "my throat feels like it is closing up." Pt was eating [place] before this event. States this has happened in the past when she was eating solid foods like mashed potatoes. MD Docherty at bedside to assess patient and educate her on starting prilosec again. Will continue to monitor patient closely.

## 2013-07-26 NOTE — ED Notes (Signed)
[employer] unavailable so Lionel December, AD accompanied charge rn to pts car and brought pts zebra stripped carry on bag to nurses station. Charge rn retuned pts keys into her purse while pt watched.

## 2013-07-26 NOTE — ED Notes (Signed)
Charge nurse spoke with [facility], psychiatrist to see this morning.

## 2013-07-26 NOTE — ED Notes (Signed)
Patient ate a bite of [place] and went to the bathroom stating her throat was closing. Patient denied being allergic to [place]. EDP notified and seeing patient at this time.

## 2013-07-26 NOTE — ED Notes (Signed)
Patient ate 3/4 breakfast.

## 2013-07-26 NOTE — BH Assessment (Signed)
Assessment Note  Mariah Reyes is an 43 y.o. female who presents to the Emergency Department complaining of a staph infection on her face onset one month ago with facial swelling and multiple facial lesions.  She states she was seen by a dermatologist in [place] and told was a rare case of staph. He gave antibiotics and a topical cream. She reports an itchy rash to the arms, legs, and back that she thinks is from the antibiotics.Pt also c/o chronic back pain related to back surgeries that "didn't go well". +SI " I want to go to sleep and not wake up". She denies having a plan. States she is tired of dealing with everything and sometimes feels she "just doesn't want to be here anymore". Pt unable to contract for safety outside of hospital and states her husband is not supportive or sympathic. She reports a h/o suicide attempt in 2012 when she took 5 sleeping pills and was hospitalized in [place]. Pt reports depression, lack of energy and feeling hopeless. Pt asked writer " How would you feel if you looked like this?"  Pt tearful and requested help for her depression. " I need to be able to care for myself and my 8 yr old son. I can't now." -A/V hall. Pt denies alcohol or drug use.    Axis I: Major Depression, Recurrent severe Axis II: Deferred Axis III:  Past Medical History  Diagnosis Date  . Kidney stones   . Skin disorder     lesion on forehead at eyebrow  . Pancreatitis   . Sphincter of Oddi dysfunction   . Ovarian cyst   . Sinus disease   . Thyroid disease   . Anemia   . Anxiety   . Blood disorder 2009  . Depression   . Dysmenorrhea   . Fibroid   . Scoliosis   . STD (sexually transmitted disease) [place]  . Urinary incontinence    Axis IV: other psychosocial or environmental problems and problems with primary support group Axis V: 31-40 impairment in reality testing   Past Medical History:  Past Medical History  Diagnosis Date  . Kidney stones   . Skin disorder      lesion on forehead at eyebrow  . Pancreatitis   . Sphincter of Oddi dysfunction   . Ovarian cyst   . Sinus disease   . Thyroid disease   . Anemia   . Anxiety   . Blood disorder 2009  . Depression   . Dysmenorrhea   . Fibroid   . Scoliosis   . STD (sexually transmitted disease) [place]  . Urinary incontinence     Past Surgical History  Procedure Laterality Date  . Cholecystectomy    . Back surgery    . Bile duct exploration  2006 2008  . Umbilical hernia repair  2013  . 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 Dilation and curettage of uterus    . Essure tubal ligation  2007    Family History:  Family History  Problem Relation Age of Onset  . Hyperlipidemia Mother   . Hypertension Father   . Breast cancer Paternal Grandmother     Social History:  reports that she has never smoked. She has never used smokeless tobacco. She reports that she drinks about .5 ounces of alcohol per week. She reports that she does not use illicit drugs.  Additional Social History:  Alcohol / Drug Use Pain  Medications: see PTA Prescriptions: see PTA Over the Counter: see PTA History of alcohol / drug use?: Yes (Pt states rarely no alcohol since 2012)  CIWA: CIWA-Ar BP: 95/50 mmHg Pulse Rate: 72 COWS:    Allergies:  Allergies  Allergen Reactions  . Latex Itching and Rash    Reactivated asthma  . Nickel Itching, Swelling and Rash  . Ciprofloxacin Hcl Hives and Rash    Home Medications:  (Not in a hospital admission)  OB/GYN Status:  Patient's last menstrual period was 06/28/2013.  General Assessment Data Location of Assessment: [facility] Is this a Tele or Face-to-Face Assessment?: Face-to-Face Is this an Initial Assessment or a Re-assessment for this encounter?: Initial Assessment Living Arrangements: Spouse/significant other;Children Can pt return to current living arrangement?: Yes Admission Status: Voluntary Is patient capable of signing voluntary admission?:  Yes Transfer from: Home Referral Source: Self/Family/Friend     [place] Living Arrangements: Spouse/significant other;Children Name of Psychiatrist:  Mayme Genta) Name of Therapist:  (none)  Education Status Is patient currently in school?: No Current Grade:  (NA) Highest grade of school patient has completed: NA Name of school: NA Contact person: NA  Risk to self Suicidal Ideation: Yes-Currently Present Suicidal Intent: Yes-Currently Present Is patient at risk for suicide?: Yes Suicidal Plan?: No Access to Means: Yes Specify Access to Suicidal Means:  (Pt has gun and many prescription medications) What has been your use of drugs/alcohol within the last 12 months?:  (Pt states rarely drink due to medical conditions. LU 2012. ) Previous Attempts/Gestures: Yes How many times?:  (once) Other Self Harm Risks:  (no) Triggers for Past Attempts: Other (Comment) (medical problems, chronic pain, marital problems) Intentional Self Injurious Behavior: None Recent stressful life event(s): Other (Comment) Persecutory voices/beliefs?:  (recent move from [place] to [facility] 11/2012, frequent illness) Depression: Yes Depression Symptoms: Tearfulness;Fatigue;Loss of interest in usual pleasures;Feeling worthless/self pity Substance abuse history and/or treatment for substance abuse?: No  Risk to Others Homicidal Ideation: No Thoughts of Harm to Others: No Current Homicidal Intent: No Current Homicidal Plan: No Access to Homicidal Means: No Identified Victim: NA History of harm to others?: No Assessment of Violence: None Noted Violent Behavior Description:  (NA) Does patient have access to weapons?: Yes (Comment) Criminal Charges Pending?: No Does patient have a court date: No  Psychosis Hallucinations: None noted Delusions: None noted  Mental Status Report Appear/Hygiene: Other (Comment) (WDL) Eye Contact: Good Motor Activity: Unremarkable Speech: Slow Level of  Consciousness: Alert Mood: Depressed;Sad;Worthless, low self-esteem Affect: Depressed;Sad Anxiety Level: Minimal Thought Processes: Coherent Judgement: Unimpaired Orientation: Person;Place;Time;Situation Obsessive Compulsive Thoughts/Behaviors: None  Cognitive Functioning Concentration: Decreased Memory: Recent Intact;Remote Intact IQ: Average Insight: Fair Impulse Control: Fair Appetite: Fair Weight Loss:  (none) Weight Gain:  (none) Sleep: Increased Total Hours of Sleep:  (6-8) Vegetative Symptoms: None  ADLScreening [facility] Assessment Services) Patient's cognitive ability adequate to safely complete daily activities?: Yes Patient able to express need for assistance with ADLs?: Yes Independently performs ADLs?: Yes (appropriate for developmental age)  Prior Inpatient Therapy Prior Inpatient Therapy: Yes Prior Therapy Dates:  (2012) Prior Therapy Facilty/Provider(s):  [facility] hospital) Reason for Treatment:  (wrote suicide letter OD on 5 OTC sleeping pills)  Prior Outpatient Therapy Prior Outpatient Therapy: Yes Prior Therapy Dates:  (07/11/13 seen psychiatrist for first time in [place]) Prior Therapy Facilty/Provider(s):  (Private Provider) Reason for Treatment:  (Depression)  ADL Screening (condition at time of admission) Patient's cognitive ability adequate to safely complete daily activities?: Yes Is  the patient deaf or have difficulty hearing?: No Does the patient have difficulty seeing, even when wearing glasses/contacts?: No Does the patient have difficulty concentrating, remembering, or making decisions?: No Patient able to express need for assistance with ADLs?: Yes Does the patient have difficulty dressing or bathing?: No Independently performs ADLs?: Yes (appropriate for developmental age) Does the patient have difficulty walking or climbing stairs?: No Weakness of Legs: None Weakness of Arms/Hands: None  Home Assistive Devices/Equipment Home Assistive  Devices/Equipment: None    Abuse/Neglect Assessment (Assessment to be complete while patient is alone) Physical Abuse: Denies Verbal Abuse: Yes, past (Comment) (Pt states as child father was yelling and she was always very upset by it.) Sexual Abuse: Denies Exploitation of patient/patient's resources: Denies Self-Neglect: Yes, past (Comment) (Pt states has no energy to care for self anymore) Values / Beliefs Cultural Requests During Hospitalization: None Spiritual Requests During Hospitalization: None Consults Spiritual Care Consult Needed: No Social Work Consult Needed: No Regulatory affairs officer (For Healthcare) Advance Directive: Patient does not have advance directive;Patient would not like information Pre-existing out of facility DNR order (yellow form or pink MOST form): No    Additional Information 1:1 In Past 12 Months?: No CIRT Risk: No Elopement Risk: No Does patient have medical clearance?: Yes     Disposition:  Disposition Initial Assessment Completed for this Encounter: Yes Disposition of Patient: Inpatient treatment program Type of inpatient treatment program: Adult  On Site Evaluation by:   Reviewed with Physician:    Apolinar Junes 07/26/2013 3:17 AM

## 2013-07-27 DIAGNOSIS — F4325 Adjustment disorder with mixed disturbance of emotions and conduct: Secondary | ICD-10-CM

## 2013-07-27 MED ORDER — OXYCODONE HCL ER 10 MG PO T12A: 30.0000 mg | EXTENDED_RELEASE_TABLET | Freq: Two times a day (BID) | ORAL | Status: DC

## 2013-07-27 MED ORDER — OXYCODONE HCL ER 10 MG PO T12A: 30.0000 mg | EXTENDED_RELEASE_TABLET | Freq: Three times a day (TID) | ORAL | Status: DC

## 2013-07-27 MED ORDER — OXYCODONE HCL ER 10 MG PO T12A
30.0000 mg | EXTENDED_RELEASE_TABLET | Freq: Once | ORAL | Status: DC
  Filled 2013-07-27: qty 3

## 2013-07-27 MED ORDER — DIPHENHYDRAMINE HCL 25 MG PO CAPS
ORAL_CAPSULE | ORAL | Status: AC
  Filled 2013-07-27: qty 2

## 2013-07-27 MED ORDER — DIPHENHYDRAMINE HCL 25 MG PO CAPS
50.0000 mg | ORAL_CAPSULE | Freq: Four times a day (QID) | ORAL | Status: DC | PRN
  Administered 2013-07-28 – 2013-07-30 (×5): 50 mg via ORAL
  Filled 2013-07-27 (×7): qty 2

## 2013-07-27 MED ORDER — ARIPIPRAZOLE 5 MG PO TABS
5.0000 mg | ORAL_TABLET | Freq: Two times a day (BID) | ORAL | Status: DC
  Administered 2013-07-27 – 2013-07-30 (×7): 5 mg via ORAL
  Filled 2013-07-27 (×4): qty 1
  Filled 2013-07-27: qty 6
  Filled 2013-07-27: qty 1
  Filled 2013-07-27: qty 6
  Filled 2013-07-27 (×6): qty 1

## 2013-07-27 MED ORDER — OXYCODONE HCL 5 MG PO TABS
10.0000 mg | ORAL_TABLET | Freq: Four times a day (QID) | ORAL | Status: DC | PRN
  Administered 2013-07-27 – 2013-07-30 (×8): 10 mg via ORAL
  Filled 2013-07-27 (×8): qty 2

## 2013-07-27 MED ORDER — ARIPIPRAZOLE 5 MG PO TABS
ORAL_TABLET | ORAL | Status: AC
  Administered 2013-07-27: 5 mg
  Filled 2013-07-27: qty 1

## 2013-07-27 MED ORDER — OXYCODONE HCL ER 10 MG PO T12A: 30.0000 mg | EXTENDED_RELEASE_TABLET | ORAL | Status: DC

## 2013-07-27 MED ORDER — OXYCODONE HCL ER 10 MG PO T12A
30.0000 mg | EXTENDED_RELEASE_TABLET | Freq: Three times a day (TID) | ORAL | Status: DC
  Administered 2013-07-27 – 2013-07-30 (×10): 30 mg via ORAL
  Filled 2013-07-27 (×10): qty 3

## 2013-07-27 NOTE — [facility]HH Suicide Risk Assessment (Signed)
   Nursing information obtained from:  Patient Demographic factors:  Caucasian Current Mental Status:  NA Loss Factors:  NA Historical Factors:  NA Risk Reduction Factors:  NA Total Time spent with patient: 30 minutes  CLINICAL FACTORS:   Severe Anxiety and/or Agitation Panic Attacks Depression:   Anhedonia Hopelessness Impulsivity Insomnia Recent sense of peace/wellbeing Severe Alcohol/Substance Abuse/Dependencies Personality Disorders:   Cluster B Chronic Pain Unstable or Poor Therapeutic Relationship Previous Psychiatric Diagnoses and Treatments Medical Diagnoses and Treatments/Surgeries  Psychiatric Specialty Exam: Physical Exam  ROS  Blood pressure 140/111, pulse 86, temperature 98.8 F (37.1 C), temperature source Oral, resp. rate 18, height 5\' 2"  (1.575 m), weight 73.029 kg (161 lb), last menstrual period 06/28/2013.Body mass index is 29.44 kg/(m^2).  General Appearance: Guarded  Eye Contact::  Minimal  Speech:  Clear and Coherent and Slow  Volume:  Decreased  Mood:  Anxious, Depressed, Dysphoric, Hopeless and Worthless  Affect:  Depressed and Tearful  Thought Process:  Goal Directed and Intact  Orientation:  Full (Time, Place, and Person)  Thought Content:  Hallucinations: Tactile, Paranoid Ideation and Rumination  Suicidal Thoughts:  Yes.  without intent/plan  Homicidal Thoughts:  No  Memory:  Immediate;   Fair  Judgement:  Impaired  Insight:  Lacking  Psychomotor Activity:  Decreased and Restlessness  Concentration:  Fair  Recall:  [employer] of Knowledge:Good  Language: Good  Akathisia:  NA  Handed:  Right  AIMS (if indicated):     Assets:  Communication Skills Desire for Improvement Financial Resources/Insurance Leisure Time Physical Health Resilience Social Support Transportation  Sleep:  Number of Hours: 5   Musculoskeletal: Strength & Muscle Tone: within normal limits Gait & Station: normal Patient leans: N/A  COGNITIVE FEATURES THAT  CONTRIBUTE TO RISK:  Closed-mindedness Loss of executive function Polarized thinking    SUICIDE RISK:   Moderate:  Frequent suicidal ideation with limited intensity, and duration, some specificity in terms of plans, no associated intent, good self-control, limited dysphoria/symptomatology, some risk factors present, and identifiable protective factors, including available and accessible social support.  PLAN OF CARE: Admit for depression, anxiety and suicidal ideations and also has a hypochondriasis regarding skin infection.   I certify that inpatient services furnished can reasonably be expected to improve the patient's condition.  Mariah Reyes 07/27/2013, 12:11 PM

## 2013-07-27 NOTE — [facility] Group Notes (Signed)
[facility] LCS[facility] Group Therapy  Feelings Around Relapse  07/27/2013 3:09 PM  Type of Therapy:  Group Therapy  Participation Level:  Did Not Attend  Eulas Post [place] Lusty 07/27/2013, 3:09 PM

## 2013-07-27 NOTE — Progress Notes (Signed)
Patient ID: Mariah Reyes, female   DOB: 06-12-1970, 43 y.o.   MRN: [id number] D: pt. C/o of pain, argumentative that medications aren't what she says home medications are. Pt. Begins to imitate hiccups and cry to draw attention, as soon as medications was administered symptoms subsided. A: Writer reviewed medication and assured patient she would see the physician in the morning to ask about medications changes. Writer staffed with York Grice NP as client has bandaged face for staph infection and stool is at lab testing for C-diff. NP placed client on enteric precautions until results arrive. Writer explained to client what this precaution ment and asked her to please stay in room and use call light for assistance. Staff will monitor q41min for safety.  R: Pt. Has agreed to these terms, staff made alert to precautions. Pt. Is safe on the unit.

## 2013-07-27 NOTE — H&P (Signed)
Psychiatric Admission Assessment Adult  Patient Identification:  Mariah Reyes Date of Evaluation:  07/27/2013 Chief Complaint:  MAJOR DEPRESSIVE DISORER,RECURRENT,MODERATE  History of Present Illness: Mariah Reyes is a 43 year old WF who presented to the [facility] yesterday reporting Staph infection and Suicidal ideation. She was evaluated for her skin lesions to her face which she has been having evaluated over the past several months. 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  There was concern that she was having an allergic reaction to her antibiotics noting that she had been on several for over a month or more, but this was ruled out. She did have an episode of "globbus" while eating in the ED but did not require any intervention.  She reported suicidal ideation without a plan for self injury but noted that she was tired of living this way. She has a history of "suicidal gesture" years ago where she took 5 OTC sleeping pills. She was admitted to [facility] in [place].   ED psych assessment reveals that the patient has thought about OD but would do it while her son is in school. She was tearful and unable to contract for safety.  The patient who has a degree in Psychology, and a Masters degree in counseling upon arrival to the unit states she is ready to leave the hospital. She denies having been suicidal and did not feel safe going home because she did not want to give her son, C-diff. She was not tested for C-diff.  She stated that she thought she would only be in the hospital for 24 hours because that's how it is in [place]. Reports that the ED staff did not tell her it would be 72 hours.       Elements:  Location:  adult in patient unit. Quality:  chronic. Severity:  moderate to severe. Timing:  7-8 months. Duration:  years. Patient has not worked since her son was born. Context:  patient and family moved to [facility] from [place] 8 months ago. She does  not want to be here.She is taking large quantities of medication.. Associated Signs/Synptoms: Depression Symptoms:  depressed mood, anhedonia, fatigue, feelings of worthlessness/guilt, (Hypo) Manic Symptoms:  Delusions, Impulsivity, Irritable Mood, Anxiety Symptoms:  Excessive Worry, Panic Symptoms, Psychotic Symptoms:  Delusions, PTSD Symptoms: NA Total Time spent with patient: 45 minutes  Psychiatric Specialty Exam: Physical Exam  Psychiatric: Her speech is normal. Her mood appears anxious. Her affect is labile. She is agitated. Thought content is delusional. Thought content is not paranoid. Cognition and memory are impaired. She expresses impulsivity and inappropriate judgment. She exhibits a depressed mood. She expresses suicidal ideation. She expresses no homicidal ideation. She expresses suicidal plans. She expresses no homicidal plans. She exhibits abnormal recent memory.  Patient is seen and the chart is reviewed. I agree with findings of the exam in the ED.    ROS  Blood pressure 140/111, pulse 86, temperature 98.8 F (37.1 C), temperature source Oral, resp. rate 18, height 5' 2"  (1.575 m), weight 73.029 kg (161 lb), last menstrual period 06/28/2013.Body mass index is 29.44 kg/(m^2).  General Appearance: Bizarre  Eye Contact::  Poor  Speech:  Clear and Coherent patient gives 1/2 answers to questions. Is Circumstantial and has to be redirected often back to the question  Volume:  Normal  Mood:  Anxious and Irritable  Affect:  Constricted needy  Thought Process:  Circumstantial and Goal Directed  Orientation:  Full (Time, Place,  and Person)  Thought Content:  NA  Suicidal Thoughts:  Yes.  without intent/plan  Homicidal Thoughts:  No  Memory:  Immediate;   Fair  Judgement:  Poor  Insight:  Lacking  Psychomotor Activity:  Normal  Concentration:  Poor  Recall:  [employer] of Knowledge:Fair  Language: Good  Akathisia:  No  Handed:  Right  AIMS (if indicated):      Assets:  Communication Skills Desire for Improvement Financial Resources/Insurance Housing Talents/Skills  Sleep:  Number of Hours: 5    Musculoskeletal: Strength & Muscle Tone: within normal limits Gait & Station: normal Patient leans: Right  Past Psychiatric History: Diagnosis:  Hospitalizations:  Outpatient Care:  Substance Abuse Care:  Self-Mutilation:  Suicidal Attempts:  Violent Behaviors:   Past Medical History:   Past Medical History  Diagnosis Date  . Kidney stones   . Skin disorder     lesion on forehead at eyebrow  . Pancreatitis   . Sphincter of Oddi dysfunction   . Ovarian cyst   . Sinus disease   . Thyroid disease   . Anemia   . Anxiety   . Blood disorder 2009  . Depression   . Dysmenorrhea   . Fibroid   . Scoliosis   . STD (sexually transmitted disease) [place]  . Urinary incontinence    None. Allergies:   Allergies  Allergen Reactions  . Latex Itching and Rash    Reactivated asthma  . Nickel Itching, Swelling and Rash  . Ciprofloxacin Hcl Hives and Rash   PTA Medications: Prescriptions prior to admission  Medication Sig Dispense Refill  . ACZONE 5 % topical gel Apply 1 application topically 2 (two) times daily.      Marland Kitchen albuterol (PROVENTIL HFA;VENTOLIN HFA) 108 (90 BASE) MCG/ACT inhaler Inhale 2 puffs into the lungs every 6 (six) hours as needed for wheezing or shortness of breath.      . ALPRAZolam (XANAX) 0.5 MG tablet Take 0.5 mg by mouth 2 (two) times daily.       Marland Kitchen amphetamine-dextroamphetamine (ADDERALL XR) 20 MG 24 hr capsule Take 20 mg by mouth daily.      . cephALEXin (KEFLEX) 500 MG capsule Take 500 mg by mouth 2 (two) times daily.      . fexofenadine-pseudoephedrine (ALLEGRA-D) 60-120 MG per tablet Take 1 tablet by mouth every morning.       Marland Kitchen FLUoxetine (PROZAC) 40 MG capsule Take 40 mg by mouth every morning.       Marland Kitchen levothyroxine (SYNTHROID, LEVOTHROID) 50 MCG tablet Take 50 mcg by mouth daily before breakfast.       . mupirocin ointment (BACTROBAN) 2 % Place 1 application into the nose 2 (two) times daily.      . norethindrone-ethinyl estradiol (JUNEL FE,GILDESS FE,LOESTRIN FE) 1-20 MG-MCG tablet Take 1 tablet by mouth daily.  1 Package  5  . OxyCODONE HCl ER 30 MG T12A Take 30 mg by mouth 3 (three) times daily. Scheduled for back pain      . zolpidem (AMBIEN CR) 12.5 MG CR tablet Take 12.5 mg by mouth at bedtime.        Previous Psychotropic Medications:  Medication/Dose                 Substance Abuse History in the last 12 months:  no  Consequences of Substance Abuse: NA  Social History:  reports that she has never smoked. She has never used smokeless tobacco. She reports that  she drinks about .5 ounces of alcohol per week. She reports that she does not use illicit drugs. Additional Social History:                      Current Place of Residence:   Place of Birth:   Family Members: Marital Status:  Married Children:  Sons:  1  Daughters: Relationships: Education:  Soil scientist Problems/Performance: Religious Beliefs/Practices: History of Abuse (Emotional/Phsycial/Sexual) Ship broker History:  None. Legal History: Hobbies/Interests:  Family History:   Family History  Problem Relation Age of Onset  . Hyperlipidemia Mother   . Hypertension Father   . Breast cancer Paternal Grandmother     Results for orders placed during the hospital encounter of 07/25/13 (from the past 72 hour(s))  CBC WITH DIFFERENTIAL     Status: None   Collection Time    07/25/13  6:33 PM      Result Value Ref Range   WBC 8.6  4.0 - 10.5 K/uL   RBC 4.32  3.87 - 5.11 MIL/uL   Hemoglobin 13.2  12.0 - 15.0 g/dL   HCT 38.8  36.0 - 46.0 %   MCV 89.8  78.0 - 100.0 fL   MCH 30.6  26.0 - 34.0 pg   MCHC 34.0  30.0 - 36.0 g/dL   RDW 12.9  11.5 - 15.5 %   Platelets 363  150 - 400 K/uL   Neutrophils Relative % 68  43 - 77 %   Neutro Abs 5.8  1.7 - 7.7 K/uL    Lymphocytes Relative 23  12 - 46 %   Lymphs Abs 1.9  0.7 - 4.0 K/uL   Monocytes Relative 6  3 - 12 %   Monocytes Absolute 0.5  0.1 - 1.0 K/uL   Eosinophils Relative 3  0 - 5 %   Eosinophils Absolute 0.3  0.0 - 0.7 K/uL   Basophils Relative 1  0 - 1 %   Basophils Absolute 0.1  0.0 - 0.1 K/uL  COMPREHENSIVE METABOLIC PANEL     Status: Abnormal   Collection Time    07/25/13  6:33 PM      Result Value Ref Range   Sodium 139  137 - 147 mEq/L   Potassium 3.9  3.7 - 5.3 mEq/L   Chloride 103  96 - 112 mEq/L   CO2 22  19 - 32 mEq/L   Glucose, Bld 117 (*) 70 - 99 mg/dL   BUN 12  6 - 23 mg/dL   Creatinine, Ser 0.62  0.50 - 1.10 mg/dL   Calcium 9.2  8.4 - 10.5 mg/dL   Total Protein 7.4  6.0 - 8.3 g/dL   Albumin 3.8  3.5 - 5.2 g/dL   AST 33  0 - 37 U/L   ALT 26  0 - 35 U/L   Alkaline Phosphatase 81  39 - 117 U/L   Total Bilirubin 0.2 (*) 0.3 - 1.2 mg/dL   GFR calc non Af Amer >90  >90 mL/min   GFR calc Af Amer >90  >90 mL/min   Comment: (NOTE)     The eGFR has been calculated using the CKD EPI equation.     This calculation has not been validated in all clinical situations.     eGFR's persistently <90 mL/min signify possible Chronic Kidney     Disease.  LIPASE, BLOOD     Status: None   Collection Time    07/25/13  6:33 PM  Result Value Ref Range   Lipase 25  11 - 59 U/L  ETHANOL     Status: None   Collection Time    07/25/13  6:33 PM      Result Value Ref Range   Alcohol, Ethyl (B) <11  0 - 11 mg/dL   Comment:            LOWEST DETECTABLE LIMIT FOR     SERUM ALCOHOL IS 11 mg/dL     FOR MEDICAL PURPOSES ONLY  URINE RAPID DRUG SCREEN (HOSP PERFORMED)     Status: Abnormal   Collection Time    07/25/13  9:18 PM      Result Value Ref Range   Opiates POSITIVE (*) NONE DETECTED   Cocaine NONE DETECTED  NONE DETECTED   Benzodiazepines POSITIVE (*) NONE DETECTED   Amphetamines POSITIVE (*) NONE DETECTED   Tetrahydrocannabinol NONE DETECTED  NONE DETECTED   Barbiturates NONE  DETECTED  NONE DETECTED   Comment:            DRUG SCREEN FOR MEDICAL PURPOSES     ONLY.  IF CONFIRMATION IS NEEDED     FOR ANY PURPOSE, NOTIFY LAB     WITHIN 5 DAYS.                LOWEST DETECTABLE LIMITS     FOR URINE DRUG SCREEN     Drug Class       Cutoff (ng/mL)     Amphetamine      1000     Barbiturate      200     Benzodiazepine   865     Tricyclics       784     Opiates          300     Cocaine          300     THC              50   Psychological Evaluations:  Assessment:   DSM5:  Schizophrenia Disorders:  Delusional Disorder (297.1) Obsessive-Compulsive Disorders:   Trauma-Stressor Disorders:   Substance/Addictive Disorders:   Depressive Disorders:    AXIS I:  Adjustment Disorder with Mixed Disturbance of Emotions and Conduct AXIS II:  Borderline Personality Dis. and Cluster B Traits AXIS III:   Past Medical History  Diagnosis Date  . Kidney stones   . Skin disorder     lesion on forehead at eyebrow  . Pancreatitis   . Sphincter of Oddi dysfunction   . Ovarian cyst   . Sinus disease   . Thyroid disease   . Anemia   . Anxiety   . Blood disorder 2009  . Depression   . Dysmenorrhea   . Fibroid   . Scoliosis   . STD (sexually transmitted disease) [place]  . Urinary incontinence    AXIS IV:  other psychosocial or environmental problems and problems related to social environment AXIS V:  41-50 serious symptoms  Treatment Plan/Recommendations:   1. Admit for crisis management and stabilization. 2. Medication management to reduce current symptoms to base line and improve the patient's overall level of functioning. 3. Treat health problems as indicated. 4. Develop treatment plan to decrease risk of relapse upon discharge and to reduce the need for readmission. 5. Psycho-social education regarding relapse prevention and self care. 6. Health care follow up as needed for medical problems. 7.  Start home medications where appropriate.  Treatment  Plan Summary: Daily contact with patient to assess and evaluate symptoms and progress in treatment Medication management Current Medications:  Current Facility-Administered Medications  Medication Dose Route Frequency Provider Last Rate Last Dose  . acetaminophen (TYLENOL) tablet 650 mg  650 mg Oral Q6H PRN Shuvon Rankin, NP   650 mg at 07/27/13 0701  . albuterol (PROVENTIL HFA;VENTOLIN HFA) 108 (90 BASE) MCG/ACT inhaler 2 puff  2 puff Inhalation Q6H PRN Shuvon Rankin, NP   2 puff at 07/27/13 0853  . ALPRAZolam Duanne Moron) tablet 0.5 mg  0.5 mg Oral BID Shuvon Rankin, NP   0.5 mg at 07/27/13 0853  . alum & mag hydroxide-simeth (MAALOX/MYLANTA) 200-200-20 MG/5ML suspension 30 mL  30 mL Oral Q4H PRN Shuvon Rankin, NP      . ARIPiprazole (ABILIFY) tablet 5 mg  5 mg Oral BID Nena Polio, PA-C      . cephALEXin (KEFLEX) capsule 500 mg  500 mg Oral TID WC & HS Shuvon Rankin, NP   500 mg at 07/27/13 0853  . Dapsone 5 % 1 application  1 application Topical BID Shuvon Rankin, NP      . diphenhydrAMINE (BENADRYL) 25 mg capsule           . diphenhydrAMINE (BENADRYL) capsule 50 mg  50 mg Oral Q6H PRN Nena Polio, PA-C      . FLUoxetine (PROZAC) capsule 40 mg  40 mg Oral q morning - 10a Shuvon Rankin, NP   40 mg at 07/27/13 0854  . levothyroxine (SYNTHROID, LEVOTHROID) tablet 50 mcg  50 mcg Oral QAC breakfast Shuvon Rankin, NP   50 mcg at 07/27/13 0701  . loratadine (CLARITIN) tablet 10 mg  10 mg Oral Daily Shuvon Rankin, NP   10 mg at 07/27/13 0855  . magnesium hydroxide (MILK OF MAGNESIA) suspension 30 mL  30 mL Oral Daily PRN Shuvon Rankin, NP      . mupirocin ointment (BACTROBAN) 2 % 1 application  1 application Nasal BID Shuvon Rankin, NP   1 application at 93/57/01 0854  . norethindrone-ethinyl estradiol (JUNEL FE,GILDESS FE,LOESTRIN FE) 1-20 MG-MCG per tablet 1 tablet  1 tablet Oral Daily Shuvon Rankin, NP      . oxyCODONE (Oxy IR/ROXICODONE) immediate release tablet 10 mg  10 mg Oral Q6H PRN Nena Polio, PA-C      . OxyCODONE (OXYCONTIN) 12 hr tablet 30 mg  30 mg Oral 3 times per day Nena Polio, PA-C      . OxyCODONE (OXYCONTIN) 12 hr tablet 30 mg  30 mg Oral Once [employer], PA-C      . zolpidem (AMBIEN) tablet 5 mg  5 mg Oral QHS PRN Lurena Nida, NP   5 mg at 07/26/13 2334    Observation Level/Precautions:  routine  Laboratory:  reviewed  Psychotherapy:  group  Medications:  Will add Abilify to current regimen  Consultations:  As needed  Discharge Concerns:  none  Estimated LOS:5-7 days  Other:  Patient is not a weekend discharge.   I certify that inpatient services furnished can reasonably be expected to improve the patient's condition.    Nena Polio 4/10/201512:13 PM  Patient was seen face-to-face for psychiatric evaluation, suicide risk assessment, case discussed with treatment team including physician extender and formulated appropriate treatment plan. Reviewed the information documented and agree with the treatment plan.  Parke Simmers Yee Gangi 07/31/2013 12:59 PM

## 2013-07-27 NOTE — Progress Notes (Signed)
D Cristin has spent most of the day in her bedroom...being unhappy and discontented. She is observed frequently ( about 8-10 times) coming [place] HALL< GOING INTO THE DAYROOM< despite this nurses instruction that pos currently includes her staying in her room WITH TH DOOR SHUT. She is manipulative, she changes her stories frequently, is seen staff splitting and fabricating.  A This nurse called her [facility] this morning and confirmed that she receives 1-2 percocets in between for breakthrough pain. She has 3 bright pink, clean raw skiln area, one on her chin, one on her forehead and one on her right nose.  Thses wounds have no s/sx of ifection, have no redness, heat, erythema and / or purulent drainage. No cultures obtained. Dry drsgs applied by this nurse and pt instructed to keep drgs in place.  R This nurse spoke with Tanna Furry, ASst DIr as well as PA N Mashburn and infection control, explained situation ( no diarrhea today, no fever, so  S/sx infection so pt will be taken off isolatin and allowed to ciculate in the hall, as long as wound beds are kept covered up. Pt is made aware of this as well as staff and charge nurse. POC to cont/.

## 2013-07-28 NOTE — Progress Notes (Signed)
Patient is observed in the hallway often and in the dayroom interacting with peers.  Her lesions are covered and no drainage noted. She inquired about her pain medication which she received and voiced no other complaints. Support and encouragement given, safety maintained on unit with 15 min checks.

## 2013-07-28 NOTE — Progress Notes (Signed)
.  Psychoeducational Group Note    Date: 07/28/2013 Time:  0930   Goal Setting Purpose of Group: To be able to set a goal that is measurable and that can be accomplished in one day Participation Level:  Active  Participation Quality:  Appropriate  Affect:  Appropriate  Cognitive:  Appropriate  Insight:  Engaged and Improving  Engagement in Group:  Engaged  Additional Comments:  Pt attended the group and partisipated  Ahmeer Tuman A  

## 2013-07-28 NOTE — Progress Notes (Signed)
Psychoeducational Group Note  Date: 07/28/2013 Time:  1015  Group Topic/Focus:  Identifying Needs:   The focus of this group is to help patients identify their personal needs that have been historically problematic and identify healthy behaviors to address their needs.  Participation Level:  Active  Participation Quality:  Appropriate  Affect:  Appropriate  Cognitive:  Oriented  Insight:  Improving  Engagement in Group:  Engaged  Additional Comments:  Pt attended the group and participated in the discussion  Jaelyn Cloninger A  

## 2013-07-28 NOTE — BHH Counselor (Signed)
Adult Comprehensive Assessment  Patient ID: Mariah Reyes, female   DOB: 07-26-1970, 43 y.o.   MRN: [id number]  Information Source: Information source: Patient  Current Stressors:  Educational / Learning stressors: NA Employment / Job issues: NA Family Relationships: Corporate investment banker / Lack of resources (include bankruptcy): NA Housing / Lack of housing: NA Physical health (include injuries & life threatening diseases): Chronic pain and skin conditions Social relationships: Isolation Substance abuse: Denies over use of Prescription meds Bereavement / Loss: Dog - 2014  Living/Environment/Situation:  Living conditions (as described by patient or guardian): Comfortable How long has patient lived in current situation?: August 2014 following move from [place] What is atmosphere in current home: Comfortable;Other (Comment) (Some strain as husband has no empathy for pt's pain)  Family History:  Marital status: Married Number of Years Married: 77 What types of issues is patient dealing with in the relationship?: Distance, husband not supportive of patient's pain both physically and emotionally "You had the surgery, get ober it and get back to work" Additional relationship information: NA Does patient have children?: Yes How many children?: 9 How is patient's relationship with their children?: Good with son  Childhood History:  By whom was/is the patient raised?: Both parents Additional childhood history information: NA Description of patient's relationship with caregiver when they were a child: "kind of controlling" Patient's description of current relationship with people who raised him/her: Still some strain but more supportive Does patient have siblings?: Yes Number of Siblings: 2 Description of patient's current relationship with siblings: Some strain with middle sister over pt's condition Did patient suffer any verbal/emotional/physical/sexual abuse as a child?: No Did patient suffer  from severe childhood neglect?: No Has patient ever been sexually abused/assaulted/raped as an adolescent or adult?: No Was the patient ever a victim of a crime or a disaster?: No Witnessed domestic violence?: No Has patient been effected by domestic violence as an adult?: No  Education:  Highest grade of school patient has completed: 2 Currently a Ship broker?: No Learning disability?: No  Employment/Work Situation:   Employment situation: Unemployed Patient's job has been impacted by current illness: No What is the longest time patient has a held a job?: 4 years Where was the patient employed at that time?: [employer] in [place] [facility] Has patient ever been in the [employer]?: No Has patient ever served in Recruitment consultant?: No  Financial Resources:   Financial resources: Income from spouse  Alcohol/Substance Abuse:   What has been your use of drugs/alcohol within the last 12 months?: Oxycodone Alcohol/Substance Abuse Treatment Hx: Past detox If yes, describe treatment: It was at a hospital in [place] as my family wanted me off pain meds but I could;n't do it Has alcohol/substance abuse ever caused legal problems?: No  Social Support System:   Patient's Community Support System: Fair Astronomer System: Parents who moved her and 1 sister Type of faith/religion: Lutheran How does patient's faith help to cope with current illness?: "It helps"  Leisure/Recreation:   Leisure and Hobbies: Read; have given up most things outside the house due to skin condition  Strengths/Needs:   What things does the patient do well?: Good mom In what areas does patient struggle / problems for patient: Depression, social anxiety and skin condition  Discharge Plan:   Does patient have access to transportation?: Yes Will patient be returning to same living situation after discharge?: Yes Currently receiving community mental health services: Yes (From Whom) (Dr Mayme Genta (psychiatrist who pt saw  first  time 3/25) and Therapist Verlin Dike) Does patient have financial barriers related to discharge medications?: No  Summary/Recommendations:   Summary and Recommendations (to be completed by the evaluator): Patient is 43 YO married Caucasian female admitted with diagnosis of Major Depression, Recurrent Severe   Rozell Searing [place]. 07/28/2013

## 2013-07-28 NOTE — BHH Group Notes (Signed)
[facility] LCSW Group Therapy  07/28/2013 3:55 PM  Type of Therapy:  Group Therapy  Participation Level:  Minimal  Participation Quality:  Appropriate  Affect:  Appropriate  Cognitive:  Alert and Oriented  Insight:  Limited  Engagement in Therapy:  Limited  Modes of Intervention:  Discussion, Education, Exploration, Rapport Building and Support  Summary of Progress/Problems: Pt came into group at the end of session.  Pt did not identify a positive support or coping skill.  Pt was respectful and engaged with other group members discussions.  Katha Hamming 07/28/2013, 3:55 PM

## 2013-07-28 NOTE — Progress Notes (Signed)
[facility] MD Progress Note  4/31/5400 8:67 PM Mariah Reyes  MRN:  [id number] Subjective:  Mariah Reyes is a 43 year old WF who presented to the [place] reporting Staph infection and Suicidal ideation. She was evaluated for her skin lesions to her face which she has been having evaluated over the past several months. 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 She reported suicidal ideation without a plan for self injury but noted that she was tired of living this way. She has a history of "suicidal gesture" years ago where she took 5 OTC sleeping pills.She reports that a lot of her stress has come from the skin infection, listed above and not being able to wear make up. Patient has been compliant with his medication and inpatient psychiatric program including milieu therapy and group therapy. Denies SI/HI/AVH, at this time.   Diagnosis:   DSM5: Schizophrenia Disorders:   Obsessive-Compulsive Disorders:   Trauma-Stressor Disorders:  Acute Stress Disorder (308.3) Substance/Addictive Disorders:   Depressive Disorders:  Major Depressive Disorder - Severe (296.23) Total Time spent with patient: 45 minutes  Axis I: Adjustment Disorder with Anxiety, Generalized Anxiety Disorder and Major Depression, Recurrent severe Axis II: Deferred Axis IV: other psychosocial or environmental problems, problems related to social environment, problems with access to health care services and problems with primary support group Axis V: 51-60 moderate symptoms  ADL's:  Intact  Sleep: Poor  Appetite:  Poor  Suicidal Ideation:  Plan:  Denies Intent:  Denies Means:  Denies Homicidal Ideation:  Plan:  Denies Intent:  Denies Means:  Denies AEB (as evidenced by):  Psychiatric Specialty Exam: Physical Exam  ROS  Blood pressure 119/80, pulse 83, temperature 98.1 F (36.7 C), temperature source Oral, resp. rate 16, height 5\' 2"  (1.575 m), weight 73.029 kg (161 lb), last menstrual period  06/28/2013.Body mass index is 29.44 kg/(m^2).  General Appearance: Casual, Neat and Well Groomed  Eye Contact::  Good  Speech:  Clear and Coherent and Normal Rate  Volume:  Normal  Mood:  Depressed, Hopeless and Worthless  Affect:  Depressed, Flat and Tearful  Thought Process:  Circumstantial and Intact  Orientation:  Full (Time, Place, and Person)  Thought Content:  WDL  Suicidal Thoughts:  No  Homicidal Thoughts:  No  Memory:  Immediate;   Good Recent;   Good Remote;   Good  Judgement:  Fair  Insight:  Fair  Psychomotor Activity:  Normal and Restlessness  Concentration:  Good  Recall:  Good  Fund of Knowledge:Good  Language: Fair  Akathisia:  No  Handed:  Right  AIMS (if indicated):     Assets:  Communication Skills Desire for [place]  Sleep:  Number of Hours: 3.75   Musculoskeletal: Strength & Muscle Tone: within normal limits Gait & Station: normal Patient leans: N/A  Current Medications: Current Facility-Administered Medications  Medication Dose Route Frequency Provider Last Rate Last Dose  . acetaminophen (TYLENOL) tablet 650 mg  650 mg Oral Q6H PRN Shuvon Rankin, NP   650 mg at 07/27/13 1627  . albuterol (PROVENTIL HFA;VENTOLIN HFA) 108 (90 BASE) MCG/ACT inhaler 2 puff  2 puff Inhalation Q6H PRN Shuvon Rankin, NP   2 puff at 07/28/13 0731  . ALPRAZolam Duanne Moron) tablet 0.5 mg  0.5 mg Oral BID Shuvon Rankin, NP   0.5 mg at 07/28/13 0845  . alum & mag hydroxide-simeth (MAALOX/MYLANTA) 200-200-20 MG/5ML suspension 30 mL  30 mL Oral Q4H PRN  Shuvon Rankin, NP      . ARIPiprazole (ABILIFY) tablet 5 mg  5 mg Oral BID Nena Polio, PA-C   5 mg at 07/28/13 0731  . cephALEXin (KEFLEX) capsule 500 mg  500 mg Oral TID WC & HS Shuvon Rankin, NP   500 mg at 07/28/13 0731  . Dapsone 5 % 1 application  1 application Topical BID Shuvon Rankin, NP      . diphenhydrAMINE (BENADRYL) capsule 50 mg  50 mg Oral Q6H PRN  Nena Polio, PA-C      . FLUoxetine (PROZAC) capsule 40 mg  40 mg Oral q morning - 10a Shuvon Rankin, NP   40 mg at 07/28/13 0731  . levothyroxine (SYNTHROID, LEVOTHROID) tablet 50 mcg  50 mcg Oral QAC breakfast Shuvon Rankin, NP   50 mcg at 07/28/13 0617  . magnesium hydroxide (MILK OF MAGNESIA) suspension 30 mL  30 mL Oral Daily PRN Shuvon Rankin, NP      . mupirocin ointment (BACTROBAN) 2 % 1 application  1 application Nasal BID Shuvon Rankin, NP   1 application at 16/01/09 0732  . norethindrone-ethinyl estradiol (JUNEL FE,GILDESS FE,LOESTRIN FE) 1-20 MG-MCG per tablet 1 tablet  1 tablet Oral Daily Shuvon Rankin, NP      . oxyCODONE (Oxy IR/ROXICODONE) immediate release tablet 10 mg  10 mg Oral Q6H PRN Nena Polio, PA-C   10 mg at 07/28/13 0914  . OxyCODONE (OXYCONTIN) 12 hr tablet 30 mg  30 mg Oral Once [employer], PA-C      . OxyCODONE (OXYCONTIN) 12 hr tablet 30 mg  30 mg Oral 3 times per day Nena Polio, PA-C   30 mg at 07/28/13 0617  . zolpidem (AMBIEN) tablet 5 mg  5 mg Oral QHS PRN Lurena Nida, NP   5 mg at 07/27/13 2131    Lab Results: No results found for this or any previous visit (from the past 48 hour(s)).  Physical Findings: AIMS: Facial and Oral Movements Muscles of Facial Expression: None, normal Lips and Perioral Area: None, normal Jaw: None, normal Tongue: None, normal,Extremity Movements Upper (arms, wrists, hands, fingers): None, normal Lower (legs, knees, ankles, toes): None, normal, Trunk Movements Neck, shoulders, hips: None, normal, Overall Severity Severity of abnormal movements (highest score from questions above): None, normal Incapacitation due to abnormal movements: None, normal Patient's awareness of abnormal movements (rate only patient's report): No Awareness, Dental Status Current problems with teeth and/or dentures?: No Does patient usually wear dentures?: No  CIWA:    COWS:     Treatment Plan Summary: Daily contact with patient to assess  and evaluate symptoms and progress in treatment Medication management  Plan:Plan: Review of chart, vital signs, medications, and notes.  1-Admit for crisis management and stabilization. Estimated length of stay 5-7 days past his current stay of 1  2-Individual and group therapy encouraged  3-Medication management for depression and anxiety to reduce current symptoms to base line and improve the patient's overall level of functioning: Medications reviewed with the patient.  4-Coping skills for depression, substance abuse, anger issues, and anxiety developing--  5-Continue crisis stabilization and management  6-Address health issues--monitoring vital signs, stable  7-Treatment plan in progress to prevent relapse of depression, angry outbursts, and anxiety  8-Psychosocial education regarding relapse prevention and self-care  9-Health care follow up as needed for any health concerns  10-Call for consult with hospitalist for additional specialty patient services as needed.   Medical Decision Making Problem Points:  Established problem, worsening (  2), Review of last therapy session (1) and Review of psycho-social stressors (1) Data Points:  Review or order clinical lab tests (1) Review and summation of old records (2) Review of medication regiment & side effects (2) Review of new medications or change in dosage (2)  I certify that inpatient services furnished can reasonably be expected to improve the patient's condition.   Gayland Curry Starkes FNP-BC 07/28/2013, 1:04 PM

## 2013-07-28 NOTE — Progress Notes (Signed)
Adult Psychoeducational Group Note  Date:  07/28/2013 Time:  1:37 AM  Group Topic/Focus:  Wrap-Up Group:   The focus of this group is to help patients review their daily goal of treatment and discuss progress on daily workbooks.  Participation Level:  Active  Participation Quality:  Appropriate  Affect:  Appropriate  Cognitive:  Alert  Insight: Appropriate  Engagement in Group:  Engaged  Modes of Intervention:  Discussion  Additional Comments:   Pt stated that she was just happy to be out of her room. She also talked about the infection on her face and how it frustrates her.  Sharmon Revere 07/28/2013, 1:37 AM

## 2013-07-28 NOTE — Progress Notes (Signed)
The focus of this group is to help patients review their daily goal of treatment and discuss progress on daily workbooks. Pt attended the evening group session and responded to all discussion prompts from the [facility]. Pt shared that today was a generally good day on the unit. She also joked that she felt half-mummified with her facial bandages. Pt reported having no additional needs from Nursing Staff this evening. Her affect was appropriate and she was observed giving encouragement to her peers during wrap-up.

## 2013-07-29 DIAGNOSIS — F332 Major depressive disorder, recurrent severe without psychotic features: Secondary | ICD-10-CM

## 2013-07-29 DIAGNOSIS — F411 Generalized anxiety disorder: Secondary | ICD-10-CM

## 2013-07-29 DIAGNOSIS — F4322 Adjustment disorder with anxiety: Secondary | ICD-10-CM

## 2013-07-29 NOTE — B[facility] Group Notes (Signed)
[place] LCSW Group Therapy No    07/29/2013 1:15PM  Type of Therapy and Topic:  Group Therapy: Stages of Change and Self-Sabotaging, Enabling Behaviors   Participation Level:  Minimal   Mood: Hesitant, anxious  Description of Group:     Learn how to identify obstacles, self-sabotaging and enabling behaviors, what are they, why do we do them and what needs do these behaviors meet? Discuss unhealthy relationships and how to have positive healthy boundaries with those that sabotage and enable. Explore aspects of self-sabotage and enabling in yourself and how to limit these self-destructive behaviors in everyday life.  Therapeutic Goals: 1. Patient will identify one obstacle that relates to self-sabotage and enabling behaviors 2. Patient will identify one personal self-sabotaging or enabling behavior they did prior to admission 3. Patient will demonstrate ability to communicate their needs through discussion and/or role plays. 4. Patient will identify where they are in Stages of Change Diagram   Summary of Patient Progress: The main focus of today's process group was to have patient identify with what "self-sabotage" means to them and use Motivational Interviewing to discuss what benefits, negative or positive, were involved in a self-identified self-sabotaging behavior. We then talked about reasons the patient may want to change the behavior and any current desire to change. Patients then had opportunity to identify where they are on the Stages of Change Cycle diagram discussed in group. Eryca was hesitant to identify any other goal than discharge. She was able to identify that she is in pre contemplation stage where others see need for change yet she feels she is doing the best she can. Renatta was actively engaged in listening   Therapeutic Modalities:   [place], LCSW

## 2013-07-29 NOTE — Progress Notes (Signed)
Psychoeducational Group Note  Date:  07/29/2013 Time:  1015  Group Topic/Focus:  Making Healthy Choices:   The focus of this group is to help patients identify negative/unhealthy choices they were using prior to admission and identify positive/healthier coping strategies to replace them upon discharge.  Participation Level:  Active  Participation Quality:  Appropriate  Affect:  Flat  Cognitive:  Oriented  Insight:  Improving  Engagement in Group:  Engaged  Additional Comments:  Added a lot to the conversation and listened to others when they shared  Jaxon Flatt A 07/29/2013  

## 2013-07-29 NOTE — Progress Notes (Signed)
D Mariah Reyes remains acutely anxious, fidigty, and nervous. She takes her medications as planned. She does attend her groups and she has processed much of the day...trying to identify heaklthier

## 2013-07-29 NOTE — Progress Notes (Signed)
Writer has observed patient active on the millieu.She attended group this evening and participated. She received her belongings that were brought in and she appeared happy to see her make-up. Writer suggested that she speak with her doctor before applying this to her face to make sure it will not irritate her skin even more. She was receptive to advise. She reports that she plans to use her support system and see her therapist more once discharged. She denies si/hi/a/v hallucinations. Safety maintained on unit with 15 min checks.

## 2013-07-29 NOTE — Progress Notes (Signed)
[facility] MD Progress Note  0/34/7425 9:56 PM Akansha Wyche  MRN:  [id number] Subjective:  Mariah Reyes is a 43 year old WF who presented to the [facility] reporting Staph infection and Suicidal ideation. Upon today's assessment patient states she is doing better. She has decided to rescind her 72 hour paper, and continue treatment as directed. Patient has been compliant with his medication and inpatient psychiatric program including milieu therapy and group therapy. Denies SI/HI/AVH, at this time.   Diagnosis:   DSM5: Schizophrenia Disorders:   Obsessive-Compulsive Disorders:   Trauma-Stressor Disorders:  Acute Stress Disorder (308.3) Substance/Addictive Disorders:   Depressive Disorders:  Major Depressive Disorder - Severe (296.23) Total Time spent with patient: 45 minutes  Axis I: Adjustment Disorder with Anxiety, Generalized Anxiety Disorder and Major Depression, Recurrent severe Axis II: Deferred Axis IV: other psychosocial or environmental problems, problems related to social environment, problems with access to health care services and problems with primary support group Axis V: 51-60 moderate symptoms  ADL's:  Intact  Sleep: Fair  Appetite:  Fair  Suicidal Ideation:  Plan:  Denies Intent:  Denies Means:  Denies Homicidal Ideation:  Plan:  Denies Intent:  Denies Means:  Denies AEB (as evidenced by):  Psychiatric Specialty Exam: Physical Exam  Constitutional: She is oriented to person, place, and time. She appears well-developed.  HENT:  Head: Normocephalic.  Eyes: Pupils are equal, round, and reactive to light.  Neck: Normal range of motion.  Respiratory: Effort normal.  GI: Soft.  Musculoskeletal: Normal range of motion.  Neurological: She is alert and oriented to person, place, and time.  Skin: Skin is warm and dry.     Psychiatric: She has a normal mood and affect. Her behavior is normal. Judgment and thought content normal.    Review of Systems    Psychiatric/Behavioral: Positive for depression and suicidal ideas. The patient is nervous/anxious.     Blood pressure 117/87, pulse 73, temperature 98.1 F (36.7 C), temperature source Oral, resp. rate 16, height 5\' 2"  (1.575 m), weight 73.029 kg (161 lb), last menstrual period 06/28/2013.Body mass index is 29.44 kg/(m^2).  General Appearance: Casual, Neat and Well Groomed  Eye Contact::  Good  Speech:  Clear and Coherent and Normal Rate  Volume:  Normal  Mood:  Depressed, Hopeless and Worthless  Affect:  Depressed, Flat and Tearful  Thought Process:  Circumstantial and Intact  Orientation:  Full (Time, Place, and Person)  Thought Content:  WDL  Suicidal Thoughts:  No  Homicidal Thoughts:  No  Memory:  Immediate;   Good Recent;   Good Remote;   Good  Judgement:  Fair  Insight:  Fair  Psychomotor Activity:  Normal and Restlessness  Concentration:  Good  Recall:  Good  Fund of Knowledge:Good  Language: Fair  Akathisia:  No  Handed:  Right  AIMS (if indicated):     Assets:  Communication Skills Desire for Improvement Housing Physical Health Social Support Vocational/Educational  Sleep:  Number of Hours: 4.25   Musculoskeletal: Strength & Muscle Tone: within normal limits Gait & Station: normal Patient leans: N/A  Current Medications: Current Facility-Administered Medications  Medication Dose Route Frequency Provider Last Rate Last Dose  . acetaminophen (TYLENOL) tablet 650 mg  650 mg Oral Q6H PRN Shuvon Rankin, NP   650 mg at 07/27/13 1627  . albuterol (PROVENTIL HFA;VENTOLIN HFA) 108 (90 BASE) MCG/ACT inhaler 2 puff  2 puff Inhalation Q6H PRN Shuvon Rankin, NP   2 puff at 07/29/13 0815  . ALPRAZolam (  XANAX) tablet 0.5 mg  0.5 mg Oral BID Shuvon Rankin, NP   0.5 mg at 07/29/13 0814  . alum & mag hydroxide-simeth (MAALOX/MYLANTA) 200-200-20 MG/5ML suspension 30 mL  30 mL Oral Q4H PRN Shuvon Rankin, NP      . ARIPiprazole (ABILIFY) tablet 5 mg  5 mg Oral BID Nena Polio, PA-C   5 mg at 07/29/13 0813  . cephALEXin (KEFLEX) capsule 500 mg  500 mg Oral TID WC & HS Shuvon Rankin, NP   500 mg at 07/29/13 1158  . Dapsone 5 % 1 application  1 application Topical BID Shuvon Rankin, NP      . diphenhydrAMINE (BENADRYL) capsule 50 mg  50 mg Oral Q6H PRN Nena Polio, PA-C   50 mg at 07/29/13 0905  . FLUoxetine (PROZAC) capsule 40 mg  40 mg Oral q morning - 10a Shuvon Rankin, NP   40 mg at 07/29/13 0813  . levothyroxine (SYNTHROID, LEVOTHROID) tablet 50 mcg  50 mcg Oral QAC breakfast Shuvon Rankin, NP   50 mcg at 07/29/13 0630  . magnesium hydroxide (MILK OF MAGNESIA) suspension 30 mL  30 mL Oral Daily PRN Shuvon Rankin, NP      . mupirocin ointment (BACTROBAN) 2 % 1 application  1 application Nasal BID Shuvon Rankin, NP   1 application at 51/02/58 0815  . norethindrone-ethinyl estradiol (JUNEL FE,GILDESS FE,LOESTRIN FE) 1-20 MG-MCG per tablet 1 tablet  1 tablet Oral Daily Shuvon Rankin, NP      . oxyCODONE (Oxy IR/ROXICODONE) immediate release tablet 10 mg  10 mg Oral Q6H PRN Nena Polio, PA-C   10 mg at 07/29/13 1200  . OxyCODONE (OXYCONTIN) 12 hr tablet 30 mg  30 mg Oral Once [employer], PA-C      . OxyCODONE (OXYCONTIN) 12 hr tablet 30 mg  30 mg Oral 3 times per day Nena Polio, PA-C   30 mg at 07/29/13 1422  . zolpidem (AMBIEN) tablet 5 mg  5 mg Oral QHS PRN Lurena Nida, NP   5 mg at 07/28/13 2116    Lab Results: No results found for this or any previous visit (from the past 48 hour(s)).  Physical Findings: AIMS: Facial and Oral Movements Muscles of Facial Expression: None, normal Lips and Perioral Area: None, normal Jaw: None, normal Tongue: None, normal,Extremity Movements Upper (arms, wrists, hands, fingers): None, normal Lower (legs, knees, ankles, toes): None, normal, Trunk Movements Neck, shoulders, hips: None, normal, Overall Severity Severity of abnormal movements (highest score from questions above): None, normal Incapacitation due  to abnormal movements: None, normal Patient's awareness of abnormal movements (rate only patient's report): No Awareness, Dental Status Current problems with teeth and/or dentures?: No Does patient usually wear dentures?: No  CIWA:    COWS:     Treatment Plan Summary: Daily contact with patient to assess and evaluate symptoms and progress in treatment Medication management  Plan:Plan: Review of chart, vital signs, medications, and notes.  1-Admit for crisis management and stabilization. Estimated length of stay 5-7 days past her current stay of 1  2-Individual and group therapy encouraged  3-Medication management for depression and anxiety to reduce current symptoms to base line and improve the patient's overall level of functioning: Medications reviewed with the patient.  4-Coping skills for depression, substance abuse, anger issues, and anxiety developing--  5-Continue crisis stabilization and management  6-Address health issues--monitoring vital signs, stable  7-Treatment plan in progress to prevent relapse of depression, angry outbursts, and anxiety  8-Psychosocial education  regarding relapse prevention and self-care  9-Health care follow up as needed for any health concerns  10-Call for consult with hospitalist for additional specialty patient services as needed.   Medical Decision Making Problem Points:  Established problem, worsening (2), Review of last therapy session (1) and Review of psycho-social stressors (1) Data Points:  Review or order clinical lab tests (1) Review and summation of old records (2) Review of medication regiment & side effects (2) Review of new medications or change in dosage (2)  I certify that inpatient services furnished can reasonably be expected to improve the patient's condition.   Nanci Pina FNP-BC 07/29/2013, 4:44 PM

## 2013-07-29 NOTE — Progress Notes (Signed)
Psychoeducational Group Note  Date:  07/29/2013 Time:  G0930roup Topic/Focus:  Making Healthy Choices:   The focus of this group is to help patients identify negative/unhealthy choices they were using prior to admission and identify positive/healthier coping strategies to replace them upon discharge.  Participation Level:  Active  Participation Quality:  Appropriate  Affect:  Appropriate  Cognitive:  Appropriate  Insight:  Engaged  Engagement in Group:  Engaged  Additional Comments:    Trixie Rude Shaquita Fort 7:38 PM. 07/29/2013

## 2013-07-30 DIAGNOSIS — F329 Major depressive disorder, single episode, unspecified: Secondary | ICD-10-CM

## 2013-07-30 MED ORDER — CEPHALEXIN 500 MG PO CAPS: 500.0000 mg | ORAL_CAPSULE | Freq: Two times a day (BID) | ORAL | Status: DC

## 2013-07-30 MED ORDER — FLUOXETINE HCL 40 MG PO CAPS: 40.0000 mg | ORAL_CAPSULE | Freq: Every morning | ORAL | Status: DC

## 2013-07-30 MED ORDER — DAPSONE 5 % EX GEL: 1.0000 "application " | Freq: Two times a day (BID) | CUTANEOUS | Status: DC

## 2013-07-30 MED ORDER — ZOLPIDEM TARTRATE ER 12.5 MG PO TBCR: 12.5000 mg | EXTENDED_RELEASE_TABLET | Freq: Every day | ORAL | Status: DC

## 2013-07-30 MED ORDER — ALPRAZOLAM 0.5 MG PO TABS: 0.5000 mg | ORAL_TABLET | Freq: Two times a day (BID) | ORAL | Status: DC

## 2013-07-30 MED ORDER — LEVOTHYROXINE SODIUM 50 MCG PO TABS: 50.0000 ug | ORAL_TABLET | Freq: Every day | ORAL | Status: DC

## 2013-07-30 MED ORDER — ARIPIPRAZOLE 5 MG PO TABS: 5.0000 mg | ORAL_TABLET | Freq: Two times a day (BID) | ORAL | Status: DC

## 2013-07-30 MED ORDER — NORETHIN ACE-ETH ESTRAD-FE 1-20 MG-MCG PO TABS: 1.0000 | ORAL_TABLET | Freq: Every day | ORAL | Status: DC

## 2013-07-30 NOTE — [facility] Suicide Risk Assessment (Signed)
   Demographic Factors:  Adolescent or young adult, Caucasian and Unemployed  Total Time spent with patient: 30 minutes  Psychiatric Specialty Exam: Physical Exam  ROS  Blood pressure 117/88, pulse 85, temperature 98 F (36.7 C), temperature source Oral, resp. rate 18, height 5\' 2"  (1.575 m), weight 73.029 kg (161 lb), last menstrual period 06/28/2013.Body mass index is 29.44 kg/(m^2).  General Appearance: Casual  Eye Contact::  Good  Speech:  Clear and Coherent  Volume:  Normal  Mood:  Euthymic  Affect:  Appropriate and Congruent  Thought Process:  Coherent and Goal Directed  Orientation:  Full (Time, Place, and Person)  Thought Content:  WDL  Suicidal Thoughts:  No  Homicidal Thoughts:  No  Memory:  Immediate;   Good  Judgement:  Good  Insight:  Good  Psychomotor Activity:  Normal  Concentration:  Good  Recall:  Good  Fund of Knowledge:Good  Language: Good  Akathisia:  NA  Handed:  Right  AIMS (if indicated):     Assets:  Communication Skills Desire for Improvement Financial Resources/Insurance Housing Intimacy Leisure Time Resilience Social Support Talents/Skills Transportation  Sleep:  Number of Hours: 5.75    Musculoskeletal: Strength & Muscle Tone: within normal limits Gait & Station: normal Patient leans: N/A   Mental Status Per Nursing Assessment::   On Admission:  NA  Current Mental Status by Physician: NA  Loss Factors: Decline in physical health  Historical Factors: NA  Risk Reduction Factors:   Responsible for children under 76 years of age, Sense of responsibility to family, Religious beliefs about death, Living with another person, especially a relative, Positive social support, Positive therapeutic relationship and Positive coping skills or problem solving skills  Continued Clinical Symptoms:  Depression:   Recent sense of peace/wellbeing Previous Psychiatric Diagnoses and Treatments Medical Diagnoses and  Treatments/Surgeries  Cognitive Features That Contribute To Risk:  Polarized thinking    Suicide Risk:  Minimal: No identifiable suicidal ideation.  Patients presenting with no risk factors but with morbid ruminations; may be classified as minimal risk based on the severity of the depressive symptoms  Discharge Diagnoses:   AXIS I:  Major Depression, single episode AXIS II:  Deferred AXIS III:   Past Medical History  Diagnosis Date  . Kidney stones   . Skin disorder     lesion on forehead at eyebrow  . Pancreatitis   . Sphincter of Oddi dysfunction   . Ovarian cyst   . Sinus disease   . Thyroid disease   . Anemia   . Anxiety   . Blood disorder 2009  . Depression   . Dysmenorrhea   . Fibroid   . Scoliosis   . STD (sexually transmitted disease) [place]  . Urinary incontinence    AXIS IV:  other psychosocial or environmental problems, problems related to social environment and problems with primary support group AXIS V:  61-70 mild symptoms  Plan Of Care/Follow-up recommendations:  Activity:  As tolerated Diet:  Regular  Is patient on multiple antipsychotic therapies at discharge:  No   Has Patient had three or more failed trials of antipsychotic monotherapy by history:  No  Recommended Plan for Multiple Antipsychotic Therapies: NA    Mariah Reyes 07/30/2013, 1:02 PM

## 2013-07-30 NOTE — Progress Notes (Signed)
D:  Patient's self inventory sheet, patient needs sleep medications, good appetite, low energy level, decreased attention span.  Rated depression and hopeless #4, anxiety #6.  Denied SI.  Has experienced pain, insomnia, allergies in past 24 hours.  Worst pain #6, pain goal #3.  "Exercise more, talk more with family and friends, journal, meet with group and therapist & MD.  Discharge plan?  No problems taking meds after discharge. A:  Medications administered per MD orders.  Emotional support and encouragement given patient. R:  Denied SI and HI.  Denied A/V hallucinations.  Will continue to monitor patient for safety with 15 minute checks.  Safety maintained.

## 2013-07-30 NOTE — Discharge Summary (Signed)
Physician Discharge Summary Note  Patient:  Mariah Reyes is an 43 y.o., female MRN:  [id number] DOB:  1970/10/09 Patient phone:  [phone] (home)  Patient address:   [address] [place] [address],  Total Time spent with patient: 30 minutes  Date of Admission:  07/26/2013 Date of Discharge: 08/01/2013  Reason for Admission:  Suicidal ideation  Discharge Diagnoses: Active Problems:   MDD (major depressive disorder), single episode, severe , no psychosis   Psychiatric Specialty Exam:  Please see D/C SRA Physical Exam  ROS  Blood pressure 117/88, pulse 85, temperature 98 F (36.7 C), temperature source Oral, resp. rate 18, height 5\' 2"  (1.575 m), weight 73.029 kg (161 lb), last menstrual period 06/28/2013.Body mass index is 29.44 kg/(m^2).  General Appearance:   Eye Contact::    Speech:    Volume:    Mood:    Affect:    Thought Process:    Orientation:    Thought Content:    Suicidal Thoughts:    Homicidal Thoughts:    Memory:    Judgement:    Insight:    Psychomotor Activity:    Concentration:    Recall:    Fund of Knowledge:  Language:   Akathisia:    Handed:    AIMS (if indicated):     Assets:    Sleep:  Number of Hours: 5.75    Past Psychiatric History: Diagnosis:  Hospitalizations:  Outpatient Care:  Substance Abuse Care:  Self-Mutilation:  Suicidal Attempts:  Violent Behaviors:   Musculoskeletal: Strength & Muscle Tone:  Gait & Station:  Patient leans:   DSM5: Discharge Diagnoses:  AXIS I: Major Depression, single episode  AXIS II: Deferred  AXIS III:  Past Medical History   Diagnosis  Date   .  Kidney stones    .  Skin disorder      lesion on forehead at eyebrow   .  Pancreatitis    .  Sphincter of Oddi dysfunction    .  Ovarian cyst    .  Sinus disease    .  Thyroid disease    .  Anemia    .  Anxiety    .  Blood disorder  2009   .  Depression    .  Dysmenorrhea    .  Fibroid    .  Scoliosis    .  STD (sexually  transmitted disease)  [place]   .  Urinary incontinence     AXIS IV: other psychosocial or environmental problems, problems related to social environment and problems with primary support group  AXIS V: 61-70 mild symptoms  Level of Care:  OP  Hospital Course:  Mariah Reyes is a 43 year old MWF who presented to the [facility] with multiple facial lesions due to a staph infection that began several months prior. She noted SI while in the ED and could not contract for safety.      Upon admission to the Adult unit, Mariah Reyes was evaluated by a psychiatrist and her symptoms were documented. Mariah Reyes who holds a [employer]'s degree in Psychology, and a Master's degree in counseling used vague responses when asked about her symptoms. She would use phrases such as "not seriously" or "not enough to worry about" or "awhile now," when asked about suicidal ideation or plans to harm herself. She resisted giving direct answers and would become circumstantial in her speech, needing to be redirected to the topic again and again.  Mariah Reyes voiced her biggest concern was giving her son "Cdiff" which had not been diagnosed, or staph. Charts indicated that the culture was MRSA negative.  She was continued on her current regimen of medication after the narcotics were verified from the pain clinic. Also of note was that she had tested positive x 2 for [place], but did not have a current [place] prescription for this. This is on report from her pain management clinic in [place].        Low dose Abilify was started as adjuvant therapy for depression and her Adderall was discontinued. There was concern that the Adderall was making her anxiety symptoms worse and causing her to have poor sleep.          She was encouraged to participate in groups and did so after the contact precautions were discontinued. She did keep her bandages on her face as asked, but this was a major source of embarrassment and concern for her.          Each  day Mariah Reyes was assessed to ascertain her response to medication and therapeutic environment. She reported appropriate reduction in symptoms and did not state any side effects to her medication.       By the day of discharge she was in a much improved condition than upon admission. She denied SI/HI clearly and reported no AVH. Mariah Reyes was no longer concerned with CDiff or giving her son staph. She was discharged home with plans to follow up as noted below.     Consults:  None  Significant Diagnostic Studies:  None  Discharge Vitals:   Blood pressure 117/88, pulse 85, temperature 98 F (36.7 C), temperature source Oral, resp. rate 18, height 5\' 2"  (1.575 m), weight 73.029 kg (161 lb), last menstrual period 06/28/2013. Body mass index is 29.44 kg/(m^2). Lab Results:   No results found for this or any previous visit (from the past 72 hour(s)).  Physical Findings: AIMS: Facial and Oral Movements Muscles of Facial Expression: None, normal Lips and Perioral Area: None, normal Jaw: None, normal Tongue: None, normal,Extremity Movements Upper (arms, wrists, hands, fingers): None, normal Lower (legs, knees, ankles, toes): None, normal, Trunk Movements Neck, shoulders, hips: None, normal, Overall Severity Severity of abnormal movements (highest score from questions above): None, normal Incapacitation due to abnormal movements: None, normal Patient's awareness of abnormal movements (rate only patient's report): No Awareness, Dental Status Current problems with teeth and/or dentures?: No Does patient usually wear dentures?: No  CIWA:    COWS:     Psychiatric Specialty Exam: See Psychiatric Specialty Exam and Suicide Risk Assessment completed by Attending Physician prior to discharge.  Discharge destination:  Home  Is patient on multiple antipsychotic therapies at discharge:  No   Has Patient had three or more failed trials of antipsychotic monotherapy by history:  No  Recommended Plan for  Multiple Antipsychotic Therapies: NA  Discharge Orders   Future Orders Complete By Expires   Diet - low sodium heart healthy  As directed    Discharge instructions  As directed    Increase activity slowly  As directed        Medication List    STOP taking these medications       amphetamine-dextroamphetamine 20 MG 24 hr capsule  Commonly known as:  ADDERALL XR     fexofenadine-pseudoephedrine 60-120 MG per tablet  Commonly known as:  ALLEGRA-D      TAKE these medications     Indication   albuterol  108 (90 BASE) MCG/ACT inhaler  Commonly known as:  PROVENTIL HFA;VENTOLIN HFA  Inhale 2 puffs into the lungs every 6 (six) hours as needed for wheezing or shortness of breath.      ALPRAZolam 0.5 MG tablet  Commonly known as:  XANAX  Take 1 tablet (0.5 mg total) by mouth 2 (two) times daily.   Indication:  Feeling Anxious     ARIPiprazole 5 MG tablet  Commonly known as:  ABILIFY  Take 1 tablet (5 mg total) by mouth 2 (two) times daily.   Indication:  Major Depressive Disorder     cephALEXin 500 MG capsule  Commonly known as:  KEFLEX  Take 1 capsule (500 mg total) by mouth 2 (two) times daily.   Indication:  Infection of the Skin and Skin Structures     Dapsone 5 % topical gel  Commonly known as:  ACZONE  Apply 1 application topically 2 (two) times daily.   Indication:  skin lesions     FLUoxetine 40 MG capsule  Commonly known as:  PROZAC  Take 1 capsule (40 mg total) by mouth every morning.   Indication:  Depression     levothyroxine 50 MCG tablet  Commonly known as:  SYNTHROID, LEVOTHROID  Take 1 tablet (50 mcg total) by mouth daily before breakfast.   Indication:  Underactive Thyroid     mupirocin ointment 2 %  Commonly known as:  BACTROBAN  Place 1 application into the nose 2 (two) times daily.     For infection   norethindrone-ethinyl estradiol 1-20 MG-MCG tablet  Commonly known as:  JUNEL FE,GILDESS FE,LOESTRIN FE  Take 1 tablet by mouth daily.    Indication:  Pregnancy     OxyCODONE HCl ER 30 MG T12a  Take 30 mg by mouth 3 (three) times daily. Scheduled for back pain      zolpidem 12.5 MG CR tablet  Commonly known as:  AMBIEN CR  Take 1 tablet (12.5 mg total) by mouth at bedtime.   Indication:  Trouble Sleeping         Follow-up recommendations:   Activities: Resume activity as tolerated. Diet: Heart healthy low sodium diet Tests: Follow up testing will be determined by your out patient provider. Comments:    Total Discharge Time:  Less than 30 minutes.  Signed: Nena Polio 07/30/2013, 11:30 AM  Patient was seen for psychiatric evaluation, suicide risk assessment and case discussed with the treatment team including physician extender and the made appropriate disposition plan.Reviewed the information documented and agree with the treatment plan.  Parke Simmers Kahle Mcqueen 08/02/2013 3:47 PM

## 2013-07-30 NOTE — Progress Notes (Addendum)
Patient has been up and active on the unit, attended group this evening and participated. Patient reports she resended her 65 hour request for discharge today and that was a tough decision she had to make even though she wanted to discharge.Patient is compliant with meds and is medicated according to pain reported She currently denies si/hi/a/v hall. Support and encouragement offered, safety maintained on unit, will continue to monitor.

## 2013-07-30 NOTE — ED Provider Notes (Signed)
Medical screening examination/treatment/procedure(s) were performed by non-physician practitioner and as supervising physician I was immediately available for consultation/collaboration.   EKG Interpretation None       Jasper Riling. Alvino Chapel, MD 07/30/13 1454

## 2013-07-30 NOTE — Progress Notes (Signed)
[facility] Adult Case Management Discharge Plan :  Will you be returning to the same living situation after discharge: Yes,  Patient is returning to her husband. At discharge, do you have transportation home?:Yes,  Patient to arrange transportation home. Do you have the ability to pay for your medications:Yes,  Patient is able to obtain medications.  Release of information consent forms completed and in the chart;  Patient's signature needed at discharge.  Patient to Follow up at: Follow-up Information   Follow up with [facility] On 08/22/2013. (Wednesday, Aug 22, 2013 at 11:15)    Contact information:   [address] [place], [place]   [address]  [phone]   [phone]      Follow up with [facility] On 07/30/2013. Deneise Lever Hodgkins will call you at home on Tuesday, April 14 to schedule an appointment.)    Contact information:   [address] [place], [place]   [address]  [phone]      Patient denies SI/HI:   Patient no longer endorsing SI/HI or other thoughts of self harm.   Safety Planning and Suicide Prevention discussed: .Reviewed with all patients during discharge planning group  [place] 07/30/2013, 3:37 PM

## 2013-07-30 NOTE — Progress Notes (Signed)
Discharge Note:  Patient discharged home with family members.  Denied SI and HI.  Denied A/V hallucinations.  Denied pain.  Suicide prevention information given and discussed with patient who stated she understood and had no questions.  Patient stated she received all her belongings, clothing, toiletries, miscellaneous items, prescriptions, medications.  Patient stated she appreciated all assistance received from [facility] staff.

## 2013-07-30 NOTE — BHH Suicide Risk Assessment (Signed)
[place] INPATIENT:  Family/Significant Other Suicide Prevention Education  Suicide Prevention Education:  Education Completed; Kizzie Furnish, Mother - [place]; has been identified by the patient as the family member/significant other with whom the patient will be residing, and identified as the person(s) who will aid the patient in the event of a mental health crisis (suicidal ideations/suicide attempt).  With written consent from the patient, the family member/significant other has been provided the following suicide prevention education, prior to the and/or following the discharge of the patient.  The suicide prevention education provided includes the following:  Suicide risk factors  Suicide prevention and interventions  National Suicide Hotline telephone number  [facility] assessment telephone number  [facility] Emergency Assistance [place] and/or Residential Mobile Crisis Unit telephone number  Request made of family/significant other to:  Remove weapons (e.g., guns, rifles, knives), all items previously/currently identified as safety concern.  Mother advised patient does not have access to guns.  Remove drugs/medications (over-the-counter, prescriptions, illicit drugs), all items previously/currently identified as a safety concern.  The family member/significant other verbalizes understanding of the suicide prevention education information provided.  The family member/significant other agrees to remove the items of safety concern listed above.  Mariah Reyes Mariah Reyes 07/30/2013, 3:46 PM

## 2013-07-30 NOTE — Tx Team (Signed)
Interdisciplinary Treatment Plan Update   Date Reviewed:  07/30/2013  Time Reviewed:  10:01 AM  Progress in Treatment:   Attending groups: Yes Participating in groups: Yes Taking medication as prescribed: Yes  Tolerating medication: Yes Family/Significant other contact made:  No, but attempt were made to contact mother. Patient understands diagnosis: Yes  Discussing patient identified problems/goals with staff: Yes Medical problems stabilized or resolved: Yes Denies suicidal/homicidal ideation: Yes Patient has not harmed self or others: Yes  For review of initial/current patient goals, please see plan of care.  Estimated Length of Stay:    Reasons for Continued Hospitalization:   New Problems/Goals identified:    Discharge Plan or Barriers:   Home with outpatient follow up to [facility] and [facility] for counseling  Additional Comments:  Attendees:  Patient: Mariah Reyes 1[birth year] 69:79 AM   Signature: Mylinda Latina, MD 07/30/2013 10:01 AM  Signature:  Nena Polio, PA 07/30/2013 10:01 AM  Signature:  Drake Leach, RN  07/30/2013 10:01 AM  Signature:Beverly Danelle Earthly, RN 07/30/2013 10:01 AM  Signature:  Thurnell Garbe RN 07/30/2013 10:01 AM  Signature:  Joette Catching, LCSW 07/30/2013 10:01 AM  Signature:   07/30/2013 10:01 AM  Signature:  Lucinda Dell, Care Coordinator [facility] 07/30/2013 10:01 AM  Signature: 07/30/2013 10:01 AM  Signature:  07/30/2013  10:01 AM  Signature:   Lars Pinks, RN URCM 07/30/2013  10:01 AM  Signature:   07/30/2013  10:01 AM    Scribe for Treatment Team:   Joette Catching,  07/30/2013 10:01 AM

## 2013-07-30 NOTE — BHH Group Notes (Signed)
[facility] LCSW Aftercare Discharge Planning Group Note   07/30/2013 12:48 PM    Participation Quality:  [place]  Mood/Affect:  Appropriate  Depression Rating:  4  Anxiety Rating:  6  Thoughts of Suicide:  No  Will you contract for safety?   NA  Current AVH:  No  Plan for Discharge/Comments:  Patient attended discharge planning group and actively participated in group.  Patient reports having follow up with [facility] and [facility].  CSW provided all participants with daily workbook.   Transportation Means: Patient has transportation.   Supports:  Patient has a support system.   Candra Wegner, [place] Post

## 2013-07-31 ENCOUNTER — Ambulatory Visit (HOSPITAL_COMMUNITY): Admission: RE | Admit: 2013-07-31 | Payer: 59 | Source: Ambulatory Visit | Admitting: Obstetrics and Gynecology

## 2013-07-31 ENCOUNTER — Telehealth: Payer: Self-pay | Admitting: *Deleted

## 2013-07-31 ENCOUNTER — Encounter (HOSPITAL_COMMUNITY): Admission: RE | Payer: Self-pay | Source: Ambulatory Visit

## 2013-07-31 SURGERY — SALPINGECTOMY, BILATERAL, LAPAROSCOPIC
Anesthesia: General

## 2013-07-31 NOTE — Telephone Encounter (Signed)
Pam calling for update since they referred her to our office.  She has called them trying to get new appointment for her surgery and they are unable to understand what she is explaining.  She told them we would not see her because she missed her pre-op appointment. Explained that patient was discharged because she missed her pre-op appointment for the 3rd time on 07-23-13 for 07-31-13 surgery date.  Explained that this was the 3rd cancellation and that MD was concerned about patient commitment to a surgical procedure with such significant procedure.   Routing to provider for final review. Patient agreeable to disposition. Will close encounter

## 2013-08-02 NOTE — Progress Notes (Addendum)
Patient Discharge Instructions:  After Visit Summary (AVS):   Faxed to:  08/02/13 Discharge Summary Note:   Faxed to:  08/02/13 Psychiatric Admission Assessment Note:   Faxed to:  08/02/13 Suicide Risk Assessment - Discharge Assessment:   Faxed to:  08/02/13 Faxed/Sent to the [facility] Care provider:  08/02/13 Faxed to [facility] @ [phone] Faxed to [place] @ [place], 08/02/2013, 1:36 PM

## 2013-08-05 ENCOUNTER — Emergency Department (INDEPENDENT_AMBULATORY_CARE_PROVIDER_SITE_OTHER)
Admission: EM | Admit: 2013-08-05 | Discharge: 2013-08-05 | Discharge status: Against Medical Advice | Payer: 59 | Source: Home / Self Care

## 2013-08-05 ENCOUNTER — Encounter (HOSPITAL_COMMUNITY): Payer: Self-pay | Admitting: Emergency Medicine

## 2013-08-05 DIAGNOSIS — R109 Unspecified abdominal pain: Secondary | ICD-10-CM

## 2013-08-05 HISTORY — DX: Hemorrhage of anus and rectum: K62.5

## 2013-08-05 HISTORY — DX: Unspecified staphylococcus as the cause of diseases classified elsewhere: B95.8

## 2013-08-05 HISTORY — DX: Dysthymic disorder: F34.1

## 2013-08-05 HISTORY — DX: Calculus of gallbladder without cholecystitis without obstruction: K80.20

## 2013-08-05 LAB — POCT URINALYSIS DIP (DEVICE)
Bilirubin Urine: NEGATIVE
GLUCOSE, UA: NEGATIVE mg/dL
HGB URINE DIPSTICK: NEGATIVE
Ketones, ur: NEGATIVE mg/dL
Leukocytes, UA: NEGATIVE
Nitrite: NEGATIVE
PROTEIN: NEGATIVE mg/dL
Specific Gravity, Urine: 1.02 (ref 1.005–1.030)
UROBILINOGEN UA: 0.2 mg/dL (ref 0.0–1.0)
pH: 7 (ref 5.0–8.0)

## 2013-08-05 LAB — POCT PREGNANCY, URINE: Preg Test, Ur: NEGATIVE

## 2013-08-05 NOTE — Discharge Instructions (Signed)
Abdominal Pain, Women °Abdominal (stomach, pelvic, or belly) pain can be caused by many things. It is important to tell your doctor: °· The location of the pain. °· Does it come and go or is it present all the time? °· Are there things that start the pain (eating certain foods, exercise)? °· Are there other symptoms associated with the pain (fever, nausea, vomiting, diarrhea)? °All of this is helpful to know when trying to find the cause of the pain. °CAUSES  °· Stomach: virus or bacteria infection, or ulcer. °· Intestine: appendicitis (inflamed appendix), regional ileitis (Crohn's disease), ulcerative colitis (inflamed colon), irritable bowel syndrome, diverticulitis (inflamed diverticulum of the colon), or cancer of the stomach or intestine. °· Gallbladder disease or stones in the gallbladder. °· Kidney disease, kidney stones, or infection. °· Pancreas infection or cancer. °· Fibromyalgia (pain disorder). °· Diseases of the female organs: °· Uterus: fibroid (non-cancerous) tumors or infection. °· Fallopian tubes: infection or tubal pregnancy. °· Ovary: cysts or tumors. °· Pelvic adhesions (scar tissue). °· Endometriosis (uterus lining tissue growing in the pelvis and on the pelvic organs). °· Pelvic congestion syndrome (female organs filling up with blood just before the menstrual period). °· Pain with the menstrual period. °· Pain with ovulation (producing an egg). °· Pain with an IUD (intrauterine device, birth control) in the uterus. °· Cancer of the female organs. °· Functional pain (pain not caused by a disease, may improve without treatment). °· Psychological pain. °· Depression. °DIAGNOSIS  °Your doctor will decide the seriousness of your pain by doing an examination. °· Blood tests. °· X-rays. °· Ultrasound. °· CT scan (computed tomography, special type of X-ray). °· MRI (magnetic resonance imaging). °· Cultures, for infection. °· Barium enema (dye inserted in the large intestine, to better view it with  X-rays). °· Colonoscopy (looking in intestine with a lighted tube). °· Laparoscopy (minor surgery, looking in abdomen with a lighted tube). °· Major abdominal exploratory surgery (looking in abdomen with a large incision). °TREATMENT  °The treatment will depend on the cause of the pain.  °· Many cases can be observed and treated at home. °· Over-the-counter medicines recommended by your caregiver. °· Prescription medicine. °· Antibiotics, for infection. °· Birth control pills, for painful periods or for ovulation pain. °· Hormone treatment, for endometriosis. °· Nerve blocking injections. °· Physical therapy. °· Antidepressants. °· Counseling with a psychologist or psychiatrist. °· Minor or major surgery. °HOME CARE INSTRUCTIONS  °· Do not take laxatives, unless directed by your caregiver. °· Take over-the-counter pain medicine only if ordered by your caregiver. Do not take aspirin because it can cause an upset stomach or bleeding. °· Try a clear liquid diet (broth or water) as ordered by your caregiver. Slowly move to a bland diet, as tolerated, if the pain is related to the stomach or intestine. °· Have a thermometer and take your temperature several times a day, and record it. °· Bed rest and sleep, if it helps the pain. °· Avoid sexual intercourse, if it causes pain. °· Avoid stressful situations. °· Keep your follow-up appointments and tests, as your caregiver orders. °· If the pain does not go away with medicine or surgery, you may try: °· Acupuncture. °· Relaxation exercises (yoga, meditation). °· Group therapy. °· Counseling. °SEEK MEDICAL CARE IF:  °· You notice certain foods cause stomach pain. °· Your home care treatment is not helping your pain. °· You need stronger pain medicine. °· You want your IUD removed. °· You feel faint or   lightheaded. °· You develop nausea and vomiting. °· You develop a rash. °· You are having side effects or an allergy to your medicine. °SEEK IMMEDIATE MEDICAL CARE IF:  °· Your  pain does not go away or gets worse. °· You have a fever. °· Your pain is felt only in portions of the abdomen. The right side could possibly be appendicitis. The left lower portion of the abdomen could be colitis or diverticulitis. °· You are passing blood in your stools (bright red or black tarry stools, with or without vomiting). °· You have blood in your urine. °· You develop chills, with or without a fever. °· You pass out. °MAKE SURE YOU:  °· Understand these instructions. °· Will watch your condition. °· Will get help right away if you are not doing well or get worse. °Document Released: 01/31/2007 Document Revised: 06/28/2011 Document Reviewed: 02/20/2009 °Exit[employer]® Patient Information ©2014 [employer]. ° °

## 2013-08-05 NOTE — ED Notes (Signed)
Pt states she wishes to just go home; states it's getting late & she'll just f/u with her dr tomorrow.  Signed AMA form.

## 2013-08-05 NOTE — ED Notes (Signed)
Pt requesting lab work to determine if her pancreatic enzymes are elevated.  C/O mid-to-left upper abd tenderness and swelling, "like I was hit hard", that feels similar to when she had pancreatitis, but "location is slightly more left".  Pain x 2 days with some nausea; no vomiting.  Finished Keflex yesterday for facial lesions/staph infection.  Had looser stools over past 2 days with 2 slight incontinent episodes.  Denies blood in stool.  Also c/o small, fine, pruritic rash to upper abd.

## 2013-08-05 NOTE — ED Provider Notes (Signed)
CSN: [id number]     Arrival date & time 08/05/13  1801 History   First MD Initiated Contact with Patient 08/05/13 1841     Chief Complaint  Patient presents with  . Abdominal Pain   (Consider location/radiation/quality/duration/timing/severity/associated sxs/prior Treatment) HPI Comments: 43 year old female complaining of pain across the epigastrium for 2 days. She has had occasional intermittent nausea without vomiting. It is associated with a few loose stools. She is requesting an abdominal workup in the urgent care to include a CMP, hepatic and pancreatic lab values. She seems to start the issue however she has had similar abdominal pain past has been treated with PPIs but she is noncompliant with that medication. The pain is worse with eating and better when she is not eating. She has a gastroenterologist and in the coming weeks as scheduled an EGD. An EGD had been scheduled earlier this month that she missed her appointment.  Patient is a 43 y.o. female presenting with abdominal pain.  Abdominal Pain Associated symptoms: diarrhea and nausea   Associated symptoms: no constipation, no fever and no vomiting     Past Medical History  Diagnosis Date  . Kidney stones   . Skin disorder     lesion on forehead at eyebrow  . Pancreatitis   . Sphincter of Oddi dysfunction   . Ovarian cyst   . Sinus disease   . Thyroid disease   . Anemia   . Anxiety   . Blood disorder 2009  . Depression   . Dysmenorrhea   . Fibroid   . Scoliosis   . STD (sexually transmitted disease) [place]  . Urinary incontinence   . Dysthymia   . Gallstones   . Rectal bleeding   . Staph infection    Past Surgical History  Procedure Laterality Date  . Cholecystectomy    . Back surgery    . Bile duct exploration  2006 2008  . Umbilical hernia repair  2013  . 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 Dilation and curettage of uterus    . Essure tubal ligation  2007   Family History  Problem  Relation Age of Onset  . Hyperlipidemia Mother   . Hypertension Father   . Breast cancer Paternal Grandmother    History  Substance Use Topics  . Smoking status: Never Smoker   . Smokeless tobacco: Never Used  . Alcohol Use: Yes   OB History   Grav Para Term Preterm Abortions TAB SAB Ect Mult Living   5 1   4 3 1   1      Review of Systems  Constitutional: Positive for activity change. Negative for fever.  HENT: Negative.   Respiratory: Negative.   Cardiovascular: Negative.   Gastrointestinal: Positive for nausea, abdominal pain and diarrhea. Negative for vomiting, constipation and blood in stool.  Genitourinary: Negative.   Psychiatric/Behavioral: Positive for sleep disturbance and agitation. The patient is nervous/anxious.     Allergies  Latex; Nickel; Keflex; and Ciprofloxacin hcl  Home Medications   Prior to Admission medications   Medication Sig Start Date End Date Taking? Authorizing Provider  ALPRAZolam Duanne Moron) 0.5 MG tablet Take 1 tablet (0.5 mg total) by mouth 2 (two) times daily. 07/30/13  Yes Nena Polio, PA-C  cephALEXin (KEFLEX) 500 MG capsule Take 1 capsule (500 mg total) by mouth 2 (two) times daily. 07/30/13  Yes Nena Polio, PA-C  FLUoxetine (PROZAC) 40 MG capsule Take 1 capsule (40 mg total)  by mouth every morning. 07/30/13  Yes Nena Polio, PA-C  OxyCODONE HCl ER 30 MG T12A Take 30 mg by mouth 3 (three) times daily. Scheduled for back pain   Yes Historical Provider, MD  albuterol (PROVENTIL HFA;VENTOLIN HFA) 108 (90 BASE) MCG/ACT inhaler Inhale 2 puffs into the lungs every 6 (six) hours as needed for wheezing or shortness of breath.    Historical Provider, MD  ARIPiprazole (ABILIFY) 5 MG tablet Take 1 tablet (5 mg total) by mouth 2 (two) times daily. 07/30/13   Nena Polio, PA-C  Dapsone (ACZONE) 5 % topical gel Apply 1 application topically 2 (two) times daily. 07/30/13   Nena Polio, PA-C  levothyroxine (SYNTHROID, LEVOTHROID) 50 MCG tablet Take 1  tablet (50 mcg total) by mouth daily before breakfast. 07/30/13   Nena Polio, PA-C  mupirocin ointment (BACTROBAN) 2 % Place 1 application into the nose 2 (two) times daily.    Historical Provider, MD  norethindrone-ethinyl estradiol (JUNEL FE,GILDESS FE,LOESTRIN FE) 1-20 MG-MCG tablet Take 1 tablet by mouth daily. 07/30/13   Nena Polio, PA-C  zolpidem (AMBIEN CR) 12.5 MG CR tablet Take 1 tablet (12.5 mg total) by mouth at bedtime. 07/30/13   Nena Polio, PA-C   BP 118/88  Pulse 92  Temp(Src) 98.2 F (36.8 C) (Oral)  Resp 16  SpO2 99% Physical Exam  Nursing note and vitals reviewed. Constitutional: She is oriented to person, place, and time. She appears well-developed and well-nourished. No distress.  Neck: Normal range of motion. Neck supple.  Cardiovascular: Normal rate.   Pulmonary/Chest: Effort normal. No respiratory distress.  Abdominal: Soft. Bowel sounds are normal. She exhibits no distension and no mass. There is tenderness. There is guarding. There is no rebound.  Tenderness primarily to the epigastrium.   Neurological: She is alert and oriented to person, place, and time.  Skin: Skin is warm and dry.  Psychiatric: Her mood appears anxious. Her speech is delayed.  Notable psychomotor agitation. Wavering voice.     ED Course  Procedures (including critical care time) Labs Review Labs Reviewed  POCT URINALYSIS DIP (DEVICE)  POCT PREGNANCY, URINE    Results for orders placed during the hospital encounter of 08/05/13  POCT URINALYSIS DIP (DEVICE)      Result Value Ref Range   Glucose, UA NEGATIVE  NEGATIVE mg/dL   Bilirubin Urine NEGATIVE  NEGATIVE   Ketones, ur NEGATIVE  NEGATIVE mg/dL   Specific Gravity, Urine 1.020  1.005 - 1.030   Hgb urine dipstick NEGATIVE  NEGATIVE   pH 7.0  5.0 - 8.0   Protein, ur NEGATIVE  NEGATIVE mg/dL   Urobilinogen, UA 0.2  0.0 - 1.0 mg/dL   Nitrite NEGATIVE  NEGATIVE   Leukocytes, UA NEGATIVE  NEGATIVE  POCT PREGNANCY, URINE       Result Value Ref Range   Preg Test, Ur NEGATIVE  NEGATIVE   Imaging Review No results found.   MDM   1. Abdominal pain in female patient     Transfer to the ED via shuttle for evaluation of abdominal pain.  Prior to the transfer pt changed mind and requests to go home. To sign AMA   Janne Napoleon, NP 08/05/13 1939

## 2013-08-06 NOTE — ED Provider Notes (Signed)
Medical screening examination/treatment/procedure(s) were performed by non-physician practitioner and as supervising physician I was immediately available for consultation/collaboration.  Philipp Deputy, M.D.  Harden Mo, MD 08/06/13 1048

## 2013-08-08 ENCOUNTER — Ambulatory Visit: Payer: 59 | Admitting: Obstetrics and Gynecology

## 2013-08-29 ENCOUNTER — Ambulatory Visit: Payer: 59 | Admitting: Obstetrics and Gynecology

## 2013-09-06 ENCOUNTER — Other Ambulatory Visit: Payer: Self-pay | Admitting: Obstetrics & Gynecology

## 2013-09-12 ENCOUNTER — Encounter (HOSPITAL_COMMUNITY)
Admission: RE | Admit: 2013-09-12 | Discharge: 2013-09-12 | Disposition: A | Discharge status: Home / Self Care | Payer: 59 | Source: Ambulatory Visit | Attending: Obstetrics & Gynecology | Admitting: Obstetrics & Gynecology

## 2013-09-12 ENCOUNTER — Encounter (HOSPITAL_COMMUNITY): Payer: Self-pay

## 2013-09-12 VITALS — BP 118/91 | HR 73 | Resp 20 | Ht 63.0 in | Wt 160.0 lb

## 2013-09-12 DIAGNOSIS — Z01812 Encounter for preprocedural laboratory examination: Secondary | ICD-10-CM | POA: Insufficient documentation

## 2013-09-12 DIAGNOSIS — Z01818 Encounter for other preprocedural examination: Secondary | ICD-10-CM

## 2013-09-12 LAB — CBC
HCT: 36.8 % (ref 36.0–46.0)
Hemoglobin: 12.2 g/dL (ref 12.0–15.0)
MCH: 29.8 pg (ref 26.0–34.0)
MCHC: 33.2 g/dL (ref 30.0–36.0)
MCV: 90 fL (ref 78.0–100.0)
Platelets: 360 10*3/uL (ref 150–400)
RBC: 4.09 MIL/uL (ref 3.87–5.11)
RDW: 13.5 % (ref 11.5–15.5)
WBC: 7.1 10*3/uL (ref 4.0–10.5)

## 2013-09-12 NOTE — Patient Instructions (Signed)
[place]  [id number]   Your procedure is scheduled on:  09/17/13  Enter through the Main Entrance of [facility] [place] up the phone at the desk and dial [phone].   Call this number if you have problems the morning of surgery: [phone]   Remember:   Do not eat food after midnight.  Do not drink clear liquids: 6 Hours before arrival.  Take these medicines the morning of surgery with A SIP OF WATER: thyroid medication and Prozac   Do not wear jewelry, make-up or nail polish.  Do not wear lotions, powders, or perfumes. You may wear deodorant.  Do not shave 48 hours prior to surgery.  Do not bring valuables to the hospital.  [facility] is not   responsible for any belongings or valuables brought to the hospital.  Contacts, dentures or bridgework may not be worn into surgery.  Leave suitcase in the car. After surgery it may be brought to your room.  For patients admitted to the hospital, checkout time is 11:00 AM the day of              discharge.   Patients discharged the day of surgery will not be allowed to drive             home.  Name and phone number of your driver: NA  Special Instructions:      Please read over the following fact sheets that you were given:   Surgical Site Infection Prevention

## 2013-09-12 NOTE — Pre-Procedure Instructions (Signed)
Pt given written instruction to go to [facility] at [place] for PICC line per order Rudean Curt, MD

## 2013-09-16 MED ORDER — DEXTROSE 5 % IV SOLN
INTRAVENOUS | Status: AC
  Administered 2013-09-17: 100 mL via INTRAVENOUS
  Filled 2013-09-16: qty 7.5

## 2013-09-17 ENCOUNTER — Ambulatory Visit (HOSPITAL_COMMUNITY)
Admission: RE | Admit: 2013-09-17 | Discharge: 2013-09-17 | Disposition: A | Discharge status: Home / Self Care | Payer: 59 | Source: Ambulatory Visit | Attending: Anesthesiology | Admitting: Anesthesiology

## 2013-09-17 ENCOUNTER — Ambulatory Visit (HOSPITAL_COMMUNITY): Admission: RE | Admit: 2013-09-17 | Payer: 59 | Source: Ambulatory Visit

## 2013-09-17 ENCOUNTER — Encounter (HOSPITAL_COMMUNITY)
Admission: RE | Disposition: A | Discharge status: Home / Self Care | Payer: Self-pay | Source: Ambulatory Visit | Attending: Obstetrics & Gynecology

## 2013-09-17 ENCOUNTER — Observation Stay (HOSPITAL_COMMUNITY)
Admission: RE | Admit: 2013-09-17 | Discharge: 2013-09-18 | Disposition: A | Discharge status: Home / Self Care | Payer: 59 | Source: Ambulatory Visit | Attending: Obstetrics & Gynecology | Admitting: Obstetrics & Gynecology

## 2013-09-17 ENCOUNTER — Encounter (HOSPITAL_COMMUNITY): Payer: 59 | Admitting: Anesthesiology

## 2013-09-17 ENCOUNTER — Ambulatory Visit (HOSPITAL_COMMUNITY): Payer: 59 | Admitting: Anesthesiology

## 2013-09-17 ENCOUNTER — Other Ambulatory Visit (HOSPITAL_COMMUNITY): Payer: Self-pay | Admitting: Anesthesiology

## 2013-09-17 ENCOUNTER — Encounter (HOSPITAL_COMMUNITY): Payer: Self-pay | Admitting: *Deleted

## 2013-09-17 DIAGNOSIS — Z9889 Other specified postprocedural states: Secondary | ICD-10-CM

## 2013-09-17 DIAGNOSIS — Z01818 Encounter for other preprocedural examination: Secondary | ICD-10-CM

## 2013-09-17 DIAGNOSIS — N318 Other neuromuscular dysfunction of bladder: Secondary | ICD-10-CM | POA: Insufficient documentation

## 2013-09-17 DIAGNOSIS — M129 Arthropathy, unspecified: Secondary | ICD-10-CM | POA: Insufficient documentation

## 2013-09-17 DIAGNOSIS — F341 Dysthymic disorder: Secondary | ICD-10-CM | POA: Insufficient documentation

## 2013-09-17 DIAGNOSIS — N72 Inflammatory disease of cervix uteri: Secondary | ICD-10-CM | POA: Insufficient documentation

## 2013-09-17 DIAGNOSIS — D649 Anemia, unspecified: Secondary | ICD-10-CM | POA: Insufficient documentation

## 2013-09-17 DIAGNOSIS — N949 Unspecified condition associated with female genital organs and menstrual cycle: Secondary | ICD-10-CM | POA: Insufficient documentation

## 2013-09-17 DIAGNOSIS — D759 Disease of blood and blood-forming organs, unspecified: Secondary | ICD-10-CM | POA: Insufficient documentation

## 2013-09-17 DIAGNOSIS — N926 Irregular menstruation, unspecified: Principal | ICD-10-CM | POA: Insufficient documentation

## 2013-09-17 DIAGNOSIS — E039 Hypothyroidism, unspecified: Secondary | ICD-10-CM | POA: Insufficient documentation

## 2013-09-17 DIAGNOSIS — N8000 Endometriosis of the uterus, unspecified: Secondary | ICD-10-CM | POA: Insufficient documentation

## 2013-09-17 HISTORY — PX: ROBOTIC ASSISTED TOTAL HYSTERECTOMY: SHX6085

## 2013-09-17 HISTORY — PX: BILATERAL SALPINGECTOMY: SHX5743

## 2013-09-17 LAB — COMPREHENSIVE METABOLIC PANEL
ALK PHOS: 68 U/L (ref 39–117)
ALT: 15 U/L (ref 0–35)
AST: 20 U/L (ref 0–37)
Albumin: 3.8 g/dL (ref 3.5–5.2)
BUN: 12 mg/dL (ref 6–23)
CO2: 26 mEq/L (ref 19–32)
Calcium: 8.9 mg/dL (ref 8.4–10.5)
Chloride: 101 mEq/L (ref 96–112)
Creatinine, Ser: 0.73 mg/dL (ref 0.50–1.10)
GFR calc non Af Amer: >90 mL/min (ref >90)
GLUCOSE: 88 mg/dL (ref 70–99)
POTASSIUM: 3.8 meq/L (ref 3.7–5.3)
Sodium: 138 mEq/L (ref 137–147)
TOTAL PROTEIN: 6.7 g/dL (ref 6.0–8.3)
Total Bilirubin: 0.3 mg/dL (ref 0.3–1.2)

## 2013-09-17 LAB — TYPE AND SCREEN
ABO/RH(D): AB POS
ANTIBODY SCREEN: NEGATIVE

## 2013-09-17 LAB — PREGNANCY, URINE: Preg Test, Ur: NEGATIVE

## 2013-09-17 LAB — SURGICAL PCR SCREEN
MRSA, PCR: NEGATIVE
STAPHYLOCOCCUS AUREUS: NEGATIVE

## 2013-09-17 LAB — ABO/RH: ABO/RH(D): AB POS

## 2013-09-17 SURGERY — ROBOTIC ASSISTED TOTAL HYSTERECTOMY
Anesthesia: General | Site: Abdomen

## 2013-09-17 MED ORDER — NEOSTIGMINE METHYLSULFATE 10 MG/10ML IV SOLN
INTRAVENOUS | Status: DC | PRN
  Administered 2013-09-17: 4 mg via INTRAVENOUS

## 2013-09-17 MED ORDER — GLYCOPYRROLATE 0.2 MG/ML IJ SOLN
INTRAMUSCULAR | Status: DC | PRN
  Administered 2013-09-17: .8 mg via INTRAVENOUS

## 2013-09-17 MED ORDER — MIDAZOLAM HCL 2 MG/2ML IJ SOLN
INTRAMUSCULAR | Status: DC | PRN
  Administered 2013-09-17: 2 mg via INTRAVENOUS

## 2013-09-17 MED ORDER — HYDROMORPHONE HCL PF 1 MG/ML IJ SOLN
INTRAMUSCULAR | Status: AC
  Administered 2013-09-17: 0.5 mg via INTRAVENOUS
  Filled 2013-09-17: qty 1

## 2013-09-17 MED ORDER — ONDANSETRON HCL 4 MG/2ML IJ SOLN
INTRAMUSCULAR | Status: DC | PRN
  Administered 2013-09-17: 4 mg via INTRAVENOUS

## 2013-09-17 MED ORDER — HYDROMORPHONE HCL PF 1 MG/ML IJ SOLN
1.0000 mg | INTRAMUSCULAR | Status: DC | PRN
  Administered 2013-09-17 (×2): 1 mg via INTRAVENOUS
  Filled 2013-09-17 (×2): qty 1

## 2013-09-17 MED ORDER — HYDROMORPHONE HCL PF 1 MG/ML IJ SOLN
0.2500 mg | INTRAMUSCULAR | Status: DC | PRN
  Administered 2013-09-17 (×5): 0.5 mg via INTRAVENOUS

## 2013-09-17 MED ORDER — METOCLOPRAMIDE HCL 5 MG/ML IJ SOLN: 10.0000 mg | Freq: Once | INTRAMUSCULAR | Status: DC | PRN

## 2013-09-17 MED ORDER — ROCURONIUM BROMIDE 100 MG/10ML IV SOLN
INTRAVENOUS | Status: AC
  Filled 2013-09-17: qty 1

## 2013-09-17 MED ORDER — BUPIVACAINE HCL (PF) 0.25 % IJ SOLN
INTRAMUSCULAR | Status: AC
  Filled 2013-09-17: qty 30

## 2013-09-17 MED ORDER — MIDAZOLAM HCL 2 MG/2ML IJ SOLN
INTRAMUSCULAR | Status: AC
  Filled 2013-09-17: qty 2

## 2013-09-17 MED ORDER — ROCURONIUM BROMIDE 100 MG/10ML IV SOLN
INTRAVENOUS | Status: DC | PRN
Start: 2013-09-17 — End: 2013-09-17
  Administered 2013-09-17: 10 mg via INTRAVENOUS
  Administered 2013-09-17: 50 mg via INTRAVENOUS

## 2013-09-17 MED ORDER — HYDROMORPHONE HCL PF 1 MG/ML IJ SOLN
INTRAMUSCULAR | Status: AC
Start: 2013-09-17 — End: 2013-09-17
  Filled 2013-09-17: qty 1

## 2013-09-17 MED ORDER — MUPIROCIN 2 % EX OINT
TOPICAL_OINTMENT | CUTANEOUS | Status: AC
  Filled 2013-09-17: qty 22

## 2013-09-17 MED ORDER — FENTANYL CITRATE 0.05 MG/ML IJ SOLN
INTRAMUSCULAR | Status: AC
  Filled 2013-09-17: qty 2

## 2013-09-17 MED ORDER — LIDOCAINE HCL (CARDIAC) 20 MG/ML IV SOLN
INTRAVENOUS | Status: DC | PRN
  Administered 2013-09-17: 70 mg via INTRAVENOUS

## 2013-09-17 MED ORDER — CELECOXIB 400 MG PO CAPS
600.0000 mg | ORAL_CAPSULE | Freq: Once | ORAL | Status: AC
  Administered 2013-09-17: 600 mg via ORAL
  Filled 2013-09-17: qty 1

## 2013-09-17 MED ORDER — IBUPROFEN 600 MG PO TABS
600.0000 mg | ORAL_TABLET | Freq: Four times a day (QID) | ORAL | Status: DC | PRN
  Administered 2013-09-18: 600 mg via ORAL
  Filled 2013-09-17: qty 1

## 2013-09-17 MED ORDER — PROPOFOL 10 MG/ML IV BOLUS
INTRAVENOUS | Status: DC | PRN
  Administered 2013-09-17: 200 mg via INTRAVENOUS

## 2013-09-17 MED ORDER — KETOROLAC TROMETHAMINE 30 MG/ML IJ SOLN
INTRAMUSCULAR | Status: DC | PRN
Start: 2013-09-17 — End: 2013-09-17
  Administered 2013-09-17: 30 mg via INTRAVENOUS

## 2013-09-17 MED ORDER — ACETAMINOPHEN 10 MG/ML IV SOLN
1000.0000 mg | Freq: Four times a day (QID) | INTRAVENOUS | Status: DC
  Administered 2013-09-17: 1000 mg via INTRAVENOUS
  Filled 2013-09-17: qty 100

## 2013-09-17 MED ORDER — ALBUTEROL SULFATE (2.5 MG/3ML) 0.083% IN NEBU: 2.5000 mg | INHALATION_SOLUTION | Freq: Four times a day (QID) | RESPIRATORY_TRACT | Status: DC | PRN

## 2013-09-17 MED ORDER — GABAPENTIN 600 MG PO TABS
600.0000 mg | ORAL_TABLET | Freq: Once | ORAL | Status: AC
  Administered 2013-09-17: 600 mg via ORAL
  Filled 2013-09-17: qty 1

## 2013-09-17 MED ORDER — ONDANSETRON HCL 4 MG/2ML IJ SOLN
INTRAMUSCULAR | Status: AC
Start: 2013-09-17 — End: 2013-09-17
  Filled 2013-09-17: qty 2

## 2013-09-17 MED ORDER — LACTATED RINGERS IR SOLN
Status: DC | PRN
  Administered 2013-09-17: 3000 mL

## 2013-09-17 MED ORDER — LEVOTHYROXINE SODIUM 50 MCG PO TABS
50.0000 ug | ORAL_TABLET | Freq: Every day | ORAL | Status: DC
  Administered 2013-09-18: 50 ug via ORAL
  Filled 2013-09-17: qty 1

## 2013-09-17 MED ORDER — MEPERIDINE HCL 25 MG/ML IJ SOLN
6.2500 mg | INTRAMUSCULAR | Status: DC | PRN
Start: 2013-09-17 — End: 2013-09-17

## 2013-09-17 MED ORDER — HYDROMORPHONE HCL PF 1 MG/ML IJ SOLN
INTRAMUSCULAR | Status: DC | PRN
  Administered 2013-09-17 (×2): 1 mg via INTRAVENOUS

## 2013-09-17 MED ORDER — FENTANYL CITRATE 0.05 MG/ML IJ SOLN
INTRAMUSCULAR | Status: DC | PRN
Start: 2013-09-17 — End: 2013-09-17
  Administered 2013-09-17 (×2): 50 ug via INTRAVENOUS
  Administered 2013-09-17: 100 ug via INTRAVENOUS
  Administered 2013-09-17: 50 ug via INTRAVENOUS
  Administered 2013-09-17: 100 ug via INTRAVENOUS

## 2013-09-17 MED ORDER — LIDOCAINE HCL (CARDIAC) 20 MG/ML IV SOLN
INTRAVENOUS | Status: AC
  Filled 2013-09-17: qty 5

## 2013-09-17 MED ORDER — BUPIVACAINE HCL (PF) 0.25 % IJ SOLN
INTRAMUSCULAR | Status: DC | PRN
  Administered 2013-09-17: 20 mL

## 2013-09-17 MED ORDER — NEOSTIGMINE METHYLSULFATE 10 MG/10ML IV SOLN
INTRAVENOUS | Status: AC
  Filled 2013-09-17: qty 1

## 2013-09-17 MED ORDER — LACTATED RINGERS IV SOLN
INTRAVENOUS | Status: DC
  Administered 2013-09-17 – 2013-09-18 (×2): via INTRAVENOUS

## 2013-09-17 MED ORDER — ZOLPIDEM TARTRATE 5 MG PO TABS
5.0000 mg | ORAL_TABLET | Freq: Every evening | ORAL | Status: DC | PRN
  Administered 2013-09-17: 5 mg via ORAL
  Filled 2013-09-17: qty 1

## 2013-09-17 MED ORDER — PROPOFOL 10 MG/ML IV EMUL
INTRAVENOUS | Status: AC
  Filled 2013-09-17: qty 20

## 2013-09-17 MED ORDER — GLYCOPYRROLATE 0.2 MG/ML IJ SOLN
INTRAMUSCULAR | Status: AC
  Filled 2013-09-17: qty 2

## 2013-09-17 MED ORDER — LACTATED RINGERS IV SOLN
INTRAVENOUS | Status: DC
  Administered 2013-09-17 (×2): via INTRAVENOUS

## 2013-09-17 MED ORDER — HYDROMORPHONE HCL PF 1 MG/ML IJ SOLN
INTRAMUSCULAR | Status: AC
  Filled 2013-09-17: qty 1

## 2013-09-17 MED ORDER — HYDROMORPHONE HCL PF 1 MG/ML IJ SOLN
1.0000 mg | INTRAMUSCULAR | Status: DC | PRN
  Administered 2013-09-17 – 2013-09-18 (×5): 1 mg via INTRAVENOUS
  Filled 2013-09-17 (×5): qty 1

## 2013-09-17 MED ORDER — FENTANYL CITRATE 0.05 MG/ML IJ SOLN
INTRAMUSCULAR | Status: AC
  Filled 2013-09-17: qty 5

## 2013-09-17 MED ORDER — MUPIROCIN 2 % EX OINT
TOPICAL_OINTMENT | Freq: Two times a day (BID) | CUTANEOUS | Status: DC
  Administered 2013-09-17: 12:00:00 via NASAL

## 2013-09-17 MED ORDER — OXYCODONE-ACETAMINOPHEN 5-325 MG PO TABS
1.0000 | ORAL_TABLET | ORAL | Status: DC | PRN
  Administered 2013-09-17 – 2013-09-18 (×2): 2 via ORAL
  Filled 2013-09-17 (×2): qty 2

## 2013-09-17 SURGICAL SUPPLY — 64 items
BAG URINE DRAINAGE (UROLOGICAL SUPPLIES) ×4 IMPLANT
BARRIER ADHS 3X4 INTERCEED (GAUZE/BANDAGES/DRESSINGS) ×4 IMPLANT
CATH FOLEY 3WAY  5CC 16FR (CATHETERS) ×2
CATH FOLEY 3WAY 5CC 16FR (CATHETERS) ×2 IMPLANT
CLOTH BEACON ORANGE TIMEOUT ST (SAFETY) ×4 IMPLANT
CONT PATH 16OZ SNAP LID 3702 (MISCELLANEOUS) ×4 IMPLANT
COVER MAYO STAND STRL (DRAPES) ×4 IMPLANT
COVER TABLE BACK 60X90 (DRAPES) ×8 IMPLANT
COVER TIP SHEARS 8 DVNC (MISCELLANEOUS) ×2 IMPLANT
COVER TIP SHEARS 8MM DA VINCI (MISCELLANEOUS) ×2
DECANTER SPIKE VIAL GLASS SM (MISCELLANEOUS) ×4 IMPLANT
DERMABOND ADVANCED (GAUZE/BANDAGES/DRESSINGS) ×4
DERMABOND ADVANCED .7 DNX12 (GAUZE/BANDAGES/DRESSINGS) ×4 IMPLANT
DRAPE CAMERA HEAD DAVINCI SI (DRAPES) ×4 IMPLANT
DRAPE HUG U DISPOSABLE (DRAPE) ×4 IMPLANT
DRAPE LG THREE QUARTER DISP (DRAPES) ×8 IMPLANT
DRAPE WARM FLUID 44X44 (DRAPE) ×4 IMPLANT
DURAPREP 26ML APPLICATOR (WOUND CARE) ×4 IMPLANT
ELECT REM PT RETURN 9FT ADLT (ELECTROSURGICAL) ×4
ELECTRODE REM PT RTRN 9FT ADLT (ELECTROSURGICAL) ×2 IMPLANT
EVACUATOR SMOKE 8.L (FILTER) ×12 IMPLANT
GAUZE VASELINE 3X9 (GAUZE/BANDAGES/DRESSINGS) IMPLANT
GLOVE BIO SURGEON STRL SZ 6.5 (GLOVE) ×3 IMPLANT
GLOVE BIO SURGEON STRL SZ7 (GLOVE) ×4 IMPLANT
GLOVE BIO SURGEONS STRL SZ 6.5 (GLOVE) ×1
GLOVE BIOGEL PI IND STRL 7.0 (GLOVE) ×6 IMPLANT
GLOVE BIOGEL PI INDICATOR 7.0 (GLOVE) ×6
GOWN STRL REUS W/TWL LRG LVL3 (GOWN DISPOSABLE) ×36 IMPLANT
IV STOPCOCK 4 WAY 40  W/Y SET (IV SOLUTION)
IV STOPCOCK 4 WAY 40 W/Y SET (IV SOLUTION) IMPLANT
KIT ACCESSORY DA VINCI DISP (KITS) ×2
KIT ACCESSORY DVNC DISP (KITS) ×2 IMPLANT
LEGGING LITHOTOMY PAIR STRL (DRAPES) ×4 IMPLANT
NEEDLE HYPO 22GX1.5 SAFETY (NEEDLE) IMPLANT
OCCLUDER COLPOPNEUMO (BALLOONS) ×12 IMPLANT
PACK LAVH (CUSTOM PROCEDURE TRAY) ×4 IMPLANT
PAD PREP 24X48 CUFFED NSTRL (MISCELLANEOUS) ×8 IMPLANT
PLUG CATH AND CAP STER (CATHETERS) ×4 IMPLANT
PROTECTOR NERVE ULNAR (MISCELLANEOUS) ×8 IMPLANT
SET CYSTO W/LG BORE CLAMP LF (SET/KITS/TRAYS/PACK) ×4 IMPLANT
SET IRRIG TUBING LAPAROSCOPIC (IRRIGATION / IRRIGATOR) ×4 IMPLANT
SOLUTION ELECTROLUBE (MISCELLANEOUS) ×4 IMPLANT
SUT VIC AB 0 CT1 27 (SUTURE) ×4
SUT VIC AB 0 CT1 27XBRD ANBCTR (SUTURE) ×4 IMPLANT
SUT VIC AB 4-0 PS2 27 (SUTURE) ×8 IMPLANT
SUT VICRYL 0 UR6 27IN ABS (SUTURE) ×8 IMPLANT
SUT VLOC 180 0 9IN  GS21 (SUTURE) ×2
SUT VLOC 180 0 9IN GS21 (SUTURE) ×2 IMPLANT
SYR 50ML LL SCALE MARK (SYRINGE) ×8 IMPLANT
SYSTEM CONVERTIBLE TROCAR (TROCAR) ×4 IMPLANT
TIP RUMI ORANGE 6.7MMX12CM (TIP) IMPLANT
TIP UTERINE 5.1X6CM LAV DISP (MISCELLANEOUS) IMPLANT
TIP UTERINE 6.7X10CM GRN DISP (MISCELLANEOUS) IMPLANT
TIP UTERINE 6.7X6CM WHT DISP (MISCELLANEOUS) IMPLANT
TIP UTERINE 6.7X8CM BLUE DISP (MISCELLANEOUS) ×4 IMPLANT
TOWEL OR 17X24 6PK STRL BLUE (TOWEL DISPOSABLE) ×12 IMPLANT
TROCAR 12M 150ML BLUNT (TROCAR) ×4 IMPLANT
TROCAR DISP BLADELESS 8 DVNC (TROCAR) ×2 IMPLANT
TROCAR DISP BLADELESS 8MM (TROCAR) ×2
TROCAR XCEL 12X100 BLDLESS (ENDOMECHANICALS) IMPLANT
TROCAR XCEL NON-BLD 5MMX100MML (ENDOMECHANICALS) ×4 IMPLANT
TUBING FILTER THERMOFLATOR (ELECTROSURGICAL) ×4 IMPLANT
WARMER LAPAROSCOPE (MISCELLANEOUS) ×4 IMPLANT
WATER STERILE IRR 1000ML POUR (IV SOLUTION) ×12 IMPLANT

## 2013-09-17 NOTE — Op Note (Signed)
09/17/2013  3:35 PM  PATIENT:  Mariah Reyes  43 y.o. female  PRE-OPERATIVE DIAGNOSIS:  Pelvic Pain, Irregular menses, S/P Essure (Nickel Allergy)  POST-OPERATIVE DIAGNOSIS:  Pelvic Pain, Irregular menses, S/P Essure (Nickel Allergy)  PROCEDURE:  Procedure(s): ROBOTIC ASSISTED TOTAL HYSTERECTOMY WITH BILATERAL SALPINGECTOMY  SURGEON:  Surgeon(s): Princess Bruins, MD  ASSISTANTS:  Julianne Handler   ANESTHESIA:   general  PROCEDURE:  Under general anesthesia with endotracheal intubation the patient is in lithotomy position. She is prepped with ChloraPrep on the abdomen and with Betadine on the suprapubic, vulvar and vaginal areas. The patient is draped as usual. The Foley is inserted in the bladder. The vaginal exam reveals a retroverted uterus with normal adnexae.  The weighted speculum is inserted in the vagina and the anterior lip of the cervix is grasped with a tenaculum. The hysterometry is at 8 cm. Dilation of the cervix with Hegar dilators. We used a #8 roomy with a medium Co-ring.  The other instruments are removed.  The supraumbilical area is infiltrated with Marcaine one quarter plain. A 2 cm incision is made at that level with a scalpel. The aponeurosis is opened with Mayo scissors and the peritoneum is opened bluntly with a finger. A pursestring stitch is applied on the aponeurosis.  The Sheryle Hail is inserted at that level and a pneumoperitoneum is created with CO2. The camera is inserted. No adhesions are seen in the abdomen or pelvis. The liver is normal in appearance.  The pelvic cavity is normal with a normal uterus in size and appearance, normal tubes bilaterally and normal ovaries.  A semicircular configuration is used for port placement.  Marcaine is infiltrated at all sites and a small incision is made with a scalpel. 2 robotic ports are inserted on the right side and one robotic port is inserted on the left lower side and a 5 mm assistant port on the left upper side.  All ports  are inserted under direct vision.  The patient is placed in deep Trendelenburg.  The robot his docked on the right.  The instruments are inserted under direct vision, with the Endo Shears scissor in the first  Arm and the PK in the second arm.  The fenestrated clamp is inserted in the third arm.  We go to the console.  Both ureters are normal anatomic position. We starts on the left side with cauterization and section of the left round ligament.  We then cauterized and sectioned the left mesosalpinx. We cauterized and sectioned the left utero-ovarian ligament. We then cauterized and sectioned on the left side of the uterus in the broad ligament.  We stopped just before the left uterine artery.  The peritoneum is opened anteriorly and we start descending the bladder.  We proceeded exactly the same way on the right side and descended the bladder further passed the core ring. We then cauterized and sectioned the left  uterine artery and cauterized and sectioned the right uterine artery.  The occluder was inflated vaginally. We opened the vaginal vault on top of the Co-ring all-around with the tip of the Endo Shears scissors. The uterus was completely detached including the cervix and both tubes.  The specimen was passed vaginally and sent to pathology.  We then switched the instruments to the cutting needle driver in the right arm, the long tip in the left arm and the PK in the third arm.  We used a V. Lock 09 inches to close the vaginal vault. We started at the  right ankle and ran this stitch to the left angle coming back on our steps to about the midline.  The needle was parked on the left abdominal wall.  We verified hemostasis which was adequate at all levels.  We therefore removed the robotic instruments and undocked the robot. We went to laparoscopy time.  We switched to the 8.5 mm camera and removed the needle from the abdomen.  We irrigated and suctioned the abdominopelvic cavities.  An Interceed was placed on  the vaginal vault.  Pictures were taken before and after the procedure.  Laparoscopist instruments were removed.  The ports were removed under direct vision.  The CO2 was evacuated.  Hemostasis was completed on all incisions with the electrocautery.  The pursestring stitch was attached at the supraumbilical incision.  We closed each incision with a subcuticular stitch of Vicryl 3-0.  Dermabond was added on all incisions.  The occluder was removed from the vagina. Hemostasis was adequate at that level as well.  The patient was brought to recovery room in good and stable status.  ESTIMATED BLOOD LOSS:  40 cc   Intake/Output Summary (Last 24 hours) at 09/17/13 1535 Last data filed at 09/17/13 1534  Gross per 24 hour  Intake   1800 ml  Output    165 ml  Net   1635 ml     BLOOD ADMINISTERED:none   LOCAL MEDICATIONS USED:  MARCAINE     SPECIMEN:  Source of Specimen:  Uterus with cervix and bilateral tubes  DISPOSITION OF SPECIMEN:  PATHOLOGY  COUNTS:  YES  PLAN OF CARE: Transfer to PACU   Princess Bruins MD  09/17/2013 at 3:35 pm

## 2013-09-17 NOTE — H&P (Signed)
Mariah Reyes is an 43 y.o. female [phone]  RP:  Pelvic pains, irregular menses s/p Essure (allergy?)  Pertinent Gynecological History: Menses: flow is moderate, irregular menses Contraception: Essure Blood transfusions: none Sexually transmitted diseases: Trichomonas Previous GYN Procedures: Essure, D+C Last mammogram: normal  Last pap: normal OB History: G5P0041   Menstrual History:  Patient's last menstrual period was 09/15/2013.    Past Medical History  Diagnosis Date  . Skin disorder     lesion on forehead at eyebrow  . Pancreatitis   . Ovarian cyst   . Sinus disease   . Thyroid disease   . Anxiety   . Depression   . Dysmenorrhea   . Fibroid   . Scoliosis   . STD (sexually transmitted disease) [place]  . Urinary incontinence   . Dysthymia   . Gallstones   . Rectal bleeding   . Staph infection   . Sphincter of Oddi dysfunction   . Kidney stones   . Anemia   . Blood disorder 2009    "high Eos count for couple of years"    Past Surgical History  Procedure Laterality Date  . Cholecystectomy    . Back surgery    . Bile duct exploration  2006 2008  . Umbilical hernia repair  2013  . 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 Dilation and curettage of uterus    . Essure tubal ligation  2007    Family History  Problem Relation Age of Onset  . Hyperlipidemia Mother   . Hypertension Father   . Breast cancer Paternal Grandmother     Social History:  reports that she has never smoked. She has never used smokeless tobacco. She reports that she drinks alcohol. She reports that she does not use illicit drugs.  Allergies:  Allergies  Allergen Reactions  . Latex Itching and Rash    Reactivated asthma  . Nickel Itching, Swelling and Rash  . Keflex [Cephalexin]     Able to take with Benadryl  . Ciprofloxacin Hcl Hives and Rash  . Doxycycline Rash    Prescriptions prior to admission  Medication Sig Dispense Refill  . albuterol (PROVENTIL  HFA;VENTOLIN HFA) 108 (90 BASE) MCG/ACT inhaler Inhale 2 puffs into the lungs every 6 (six) hours as needed for wheezing or shortness of breath.      . ALPRAZolam (XANAX XR) 0.5 MG 24 hr tablet Take 0.5 mg by mouth daily.      . cephALEXin (KEFLEX) 500 MG capsule Take 1 capsule (500 mg total) by mouth 2 (two) times daily.      . Dapsone (ACZONE) 5 % topical gel Apply 1 application topically 2 (two) times daily.  30 g  0  . FLUoxetine (PROZAC) 40 MG capsule Take 60 mg by mouth every morning.      Marland Kitchen levothyroxine (SYNTHROID, LEVOTHROID) 50 MCG tablet Take 1 tablet (50 mcg total) by mouth daily before breakfast.      . mupirocin ointment (BACTROBAN) 2 % Place 1 application into the nose 2 (two) times daily.      . norethindrone-ethinyl estradiol (JUNEL FE,GILDESS FE,LOESTRIN FE) 1-20 MG-MCG tablet Take 1 tablet by mouth daily.  1 Package  5  . OxyCODONE HCl ER 30 MG T12A Take 30 mg by mouth 3 (three) times daily. Scheduled for back pain      . zolpidem (AMBIEN CR) 12.5 MG CR tablet Take 1 tablet (12.5 mg total) by mouth at bedtime.  0    ROS  Blood pressure 115/79, pulse 82, temperature 98.8 F (37.1 C), temperature source Oral, resp. rate 20, height 5\' 3"  (1.6 m), weight 72.576 kg (160 lb), last menstrual period 09/15/2013, SpO2 100.00%. Physical Exam  Pelvic US Uterus 155 cc, endometrium 4.3 mm, ovaries wnl.  Results for orders placed during the hospital encounter of 09/17/13 (from the past 24 hour(s))  PREGNANCY, URINE     Status: None   Collection Time    09/17/13 11:05 AM      Result Value Ref Range   Preg Test, Ur NEGATIVE  NEGATIVE    Ir Fluoro Guide Cv Line Right  09/17/2013   CLINICAL DATA:  Preop hysterectomy, IV access needed.  EXAM: PICC PLACEMENT WITH ULTRASOUND AND FLUOROSCOPY  FLUOROSCOPY TIME:  18 seconds  TECHNIQUE: After written informed consent was obtained, patient was placed in the supine position on angiographic table. Patency of the right brachial vein was confirmed  with ultrasound with image documentation. An appropriate skin site was determined. Skin site was marked. Region was prepped using maximum barrier technique including cap and mask, sterile gown, sterile gloves, large sterile sheet, and Chlorhexidine as cutaneous antisepsis. The region was infiltrated locally with 1% lidocaine. Under real-time ultrasound guidance, the right brachial vein was accessed with a 21 gauge micropuncture needle; the needle tip within the vein was confirmed with ultrasound image documentation. Needle exchanged over a 018 guidewire for a peel-away sheath, through which a 5-French double-lumen power injectable PICC trimmed to 40cm was advanced, positioned with its tip near the cavoatrial junction. Spot chest radiograph confirms appropriate catheter position. Catheter was flushed per protocol and secured externally with 0-Prolene sutures. The patient tolerated procedure well, with no immediate complication.  IMPRESSION: Technically successful five [place] double lumen power injectable PICC placement   Electronically Signed   By: Arne Cleveland M.D.   On: 09/17/2013 10:58   Ir US Guide Vasc Access Right  09/17/2013   CLINICAL DATA:  Preop hysterectomy, IV access needed.  EXAM: PICC PLACEMENT WITH ULTRASOUND AND FLUOROSCOPY  FLUOROSCOPY TIME:  18 seconds  TECHNIQUE: After written informed consent was obtained, patient was placed in the supine position on angiographic table. Patency of the right brachial vein was confirmed with ultrasound with image documentation. An appropriate skin site was determined. Skin site was marked. Region was prepped using maximum barrier technique including cap and mask, sterile gown, sterile gloves, large sterile sheet, and Chlorhexidine as cutaneous antisepsis. The region was infiltrated locally with 1% lidocaine. Under real-time ultrasound guidance, the right brachial vein was accessed with a 21 gauge micropuncture needle; the needle tip within the vein was  confirmed with ultrasound image documentation. Needle exchanged over a 018 guidewire for a peel-away sheath, through which a 5-French double-lumen power injectable PICC trimmed to 40cm was advanced, positioned with its tip near the cavoatrial junction. Spot chest radiograph confirms appropriate catheter position. Catheter was flushed per protocol and secured externally with 0-Prolene sutures. The patient tolerated procedure well, with no immediate complication.  IMPRESSION: Technically successful five [place] double lumen power injectable PICC placement   Electronically Signed   By: Arne Cleveland M.D.   On: 09/17/2013 10:58    Assessment/Plan: Pelvic pains, irregular menses s/p Essure (allergy?).  Surgery and risks reviewed.  Mariah Reyes 09/17/2013, 12:33 PM

## 2013-09-17 NOTE — Anesthesia Preprocedure Evaluation (Addendum)
Anesthesia Evaluation  Patient identified by MRN, date of birth, ID band Patient awake    Reviewed: Allergy & Precautions, H&P , NPO status , Patient's Chart, lab work & pertinent test results  Airway Mallampati: II TM Distance: >3 FB Neck ROM: Full    Dental no notable dental hx. (+) Teeth Intact   Pulmonary neg pulmonary ROS,  breath sounds clear to auscultation  Pulmonary exam normal       Cardiovascular negative cardio ROS  Rhythm:Regular Rate:Normal     Neuro/Psych PSYCHIATRIC DISORDERS Anxiety Depression    GI/Hepatic Sphincter of Oddi dysfunction Hx/o pancreatitis   Endo/Other  Hypothyroidism   Renal/GU Renal diseaseHx/o renal calculi Bladder dysfunction  SUI    Musculoskeletal  (+) Arthritis -,   Abdominal   Peds  Hematology  (+) Blood dyscrasia, anemia ,   Anesthesia Other Findings   Reproductive/Obstetrics Fibroid uterus                         Anesthesia Physical Anesthesia Plan  ASA: II  Anesthesia Plan: General   Post-op Pain Management:    Induction: Intravenous  Airway Management Planned: Oral ETT  Additional Equipment:   Intra-op Plan:   Post-operative Plan: Extubation in OR  Informed Consent: I have reviewed the patients History and Physical, chart, labs and discussed the procedure including the risks, benefits and alternatives for the proposed anesthesia with the patient or authorized representative who has indicated his/her understanding and acceptance.   Dental advisory given  Plan Discussed with: Anesthesiologist, CRNA and Surgeon  Anesthesia Plan Comments:         Anesthesia Quick Evaluation

## 2013-09-17 NOTE — Anesthesia Postprocedure Evaluation (Signed)
Anesthesia Post Note  Patient: Mariah Reyes  Procedure(s) Performed: Procedure(s) (LRB): ROBOTIC ASSISTED TOTAL HYSTERECTOMY  (N/A) BILATERAL SALPINGECTOMY (Bilateral)  Anesthesia type: General  Patient location: PACU  Post pain: Pain level controlled  Post assessment: Post-op Vital signs reviewed  Last Vitals:  Filed Vitals:   09/17/13 1630  BP: 119/83  Pulse: 73  Temp:   Resp: 22    Post vital signs: Reviewed  Level of consciousness: sedated  Complications: No apparent anesthesia complications

## 2013-09-17 NOTE — Transfer of Care (Signed)
Immediate Anesthesia Transfer of Care Note  Patient: Mariah Reyes  Procedure(s) Performed: Procedure(s): ROBOTIC ASSISTED TOTAL HYSTERECTOMY  (N/A) BILATERAL SALPINGECTOMY (Bilateral)  Patient Location: PACU  Anesthesia Type:General  Level of Consciousness: awake, alert  and oriented  Airway & Oxygen Therapy: Patient Spontanous Breathing and Patient connected to nasal cannula oxygen  Post-op Assessment: Report given to PACU RN and Post -op Vital signs reviewed and stable  Post vital signs: Reviewed and stable  Complications: No apparent anesthesia complications

## 2013-09-17 NOTE — Procedures (Signed)
R arm PowerPICC placed under US and fluoroscopy No ptx on spot chest radiograph. No complication No blood loss. See complete dictation in [facility] PACS.  

## 2013-09-18 ENCOUNTER — Encounter (HOSPITAL_COMMUNITY): Payer: Self-pay | Admitting: Obstetrics & Gynecology

## 2013-09-18 LAB — CBC
HEMATOCRIT: 34.2 % — AB (ref 36.0–46.0)
Hemoglobin: 11 g/dL — ABNORMAL LOW (ref 12.0–15.0)
MCH: 29.6 pg (ref 26.0–34.0)
MCHC: 32.2 g/dL (ref 30.0–36.0)
MCV: 92.2 fL (ref 78.0–100.0)
PLATELETS: 282 10*3/uL (ref 150–400)
RBC: 3.71 MIL/uL — AB (ref 3.87–5.11)
RDW: 13.4 % (ref 11.5–15.5)
WBC: 10 10*3/uL (ref 4.0–10.5)

## 2013-09-18 LAB — GLUCOSE, CAPILLARY: Glucose-Capillary: 93 mg/dL (ref 70–99)

## 2013-09-18 MED ORDER — OXYCODONE-ACETAMINOPHEN 7.5-325 MG PO TABS: 1.0000 | ORAL_TABLET | ORAL | Status: DC | PRN

## 2013-09-18 NOTE — Addendum Note (Signed)
Addendum created 09/18/13 1030 by Flossie Dibble, CRNA   Modules edited: Notes Section   Notes Section:  File: [id number]

## 2013-09-18 NOTE — Discharge Instructions (Signed)
Total Laparoscopic Hysterectomy, Care After  Refer to this sheet in the next few weeks. These instructions provide you with information on caring for yourself after your procedure. Your health care provider may also give you more specific instructions. Your treatment has been planned according to current medical practices, but problems sometimes occur. Call your health care provider if you have any problems or questions after your procedure.  WHAT TO EXPECT AFTER THE PROCEDURE  · Pain and bruising at the incision sites. You will be given pain medicine to control it.  · Menopausal symptoms such as hot flashes, night sweats, and insomnia if your ovaries were removed.  · Sore throat from the breathing tube that was inserted during surgery.  HOME CARE INSTRUCTIONS  · Only take over-the-counter or prescription medicines for pain, discomfort, or fever as directed by your health care provider.    · Do not take aspirin. It can cause bleeding.    · Do not drive when taking pain medicine.    · Follow your health care provider's advice regarding diet, exercise, lifting, driving, and general activities.    · Resume your usual diet as directed and allowed.    · Get plenty of rest and sleep.    · Do not douche, use tampons, or have sexual intercourse for at least 6 weeks, or until your health care provider gives you permission.    · Change your bandages (dressings) as directed by your health care provider.    · Monitor your temperature and notify your health care provider of a fever.    · Take showers instead of baths for 2 3 weeks.    · Do not drink alcohol until your health care provider gives you permission.    · If you develop constipation, you may take a mild laxative with your health care provider's permission. Bran foods may help with constipation problems. Drinking enough fluids to keep your urine clear or pale yellow may help as well.    · Try to have someone home with you for 1 2 weeks to help around the house.     · Keep all of your follow-up appointments as directed by your health care provider.    SEEK MEDICAL CARE IF:  · You have swelling, redness, or increasing pain around your incision sites.    · You have pus coming from your incision.    · You notice a bad smell coming from your incision.    · Your incision breaks open.    · You feel dizzy or lightheaded.    · You have pain or bleeding when you urinate.    · You have persistent diarrhea.    · You have persistent nausea and vomiting.    · You have abnormal vaginal discharge.    · You have a rash.    · You have any type of abnormal reaction or develop an allergy to your medicine.    · You have poor pain control with your prescribed medicine.    SEEK IMMEDIATE MEDICAL CARE IF:  · You have chest pain or shortness of breath.  · You have severe abdominal pain that is not relieved with pain medicine.  · You have pain or swelling in your legs.  Document Released: 01/24/2013 Document Reviewed: 10/24/2012  [employer]® Patient Information ©2014 [employer].

## 2013-09-18 NOTE — Progress Notes (Signed)
1 Day Post-Op Procedure(s) (LRB): ROBOTIC ASSISTED TOTAL HYSTERECTOMY  (N/A) BILATERAL SALPINGECTOMY (Bilateral)  Subjective: Patient reports that pain is not well managed.  Patient has h/o Narcotic abuse. Tolerating normal diet as tolerated  diet without difficulty. No nausea / vomiting.  Ambulating and voiding.  Objective: BP 82/48  Pulse 64  Temp(Src) 98.4 F (36.9 C) (Oral)  Resp 18  Ht 5\' 3"  (1.6 m)  Wt 72.576 kg (160 lb)  BMI 28.35 kg/m2  SpO2 98%  LMP 09/15/2013 Lungs: clear Heart: normal rate and rhythm Abdomen:soft and appropriately tender Extremities: Homans sign is negative, no sign of DVT Incision: healing well  Hb post op 6/2 at 11.0, wbc 10.0  Assessment: s/p Procedure(s): ROBOTIC ASSISTED TOTAL HYSTERECTOMY  BILATERAL SALPINGECTOMY: progressing well and tolerating diet Narcotic abuse.  Plan: Discharge home Will f/u with Pain management MD.  LOS: 1 day    Mariah Reyes 09/18/2013, 8:05 AM

## 2013-09-18 NOTE — Anesthesia Postprocedure Evaluation (Signed)
Anesthesia Post Note  Patient: Mariah Reyes  Procedure(s) Performed: Procedure(s): ROBOTIC ASSISTED TOTAL HYSTERECTOMY  (N/A) BILATERAL SALPINGECTOMY (Bilateral)  Anesthesia type: General  Patient location: [facility]  Post pain: Pain level controlled  Post assessment: Post-op Vital signs reviewed  Last Vitals: BP 82/48  Pulse 64  Temp(Src) 36.9 C (Oral)  Resp 18  Ht 5\' 3"  (1.6 m)  Wt 160 lb (72.576 kg)  BMI 28.35 kg/m2  SpO2 98%  LMP 09/15/2013  Post vital signs: Reviewed  Level of consciousness: awake  Complications: No apparent anesthesia complications

## 2013-09-18 NOTE — Progress Notes (Signed)
Pt is discharged in the care of Mother with N.T. Escort. Denies any pain or discomfort. Spirits are good Discharge instructions with Rx were given to pt with good understanding Questions were asked and answered.. No equipment needed for home use. Stable . Lapsites are clean and dry.

## 2013-09-18 NOTE — Discharge Summary (Signed)
  Physician Discharge Summary  Patient ID: Mariah Reyes MRN: [id number] DOB/AGE: [birth year] 43 y.o.  Admit date: 09/17/2013 Discharge date: 09/18/2013  Admission Diagnoses: Pelvic pain, irregular menses, S/P Essure (Nickle allergy)  Discharge Diagnoses: Pelvic pain, irregular menses, S/P Essure (Nickle allergy)        Active Problems:   Postoperative state   Discharged Condition: good  Hospital Course: good  Consults: None  Treatments: surgery: Robotic total laparoscopic hysterectomy with bilateral salpingectomies  Disposition: 07-Left Against Medical Advice     Medication List    STOP taking these medications       OxyCODONE HCl ER 30 MG T12a      TAKE these medications       albuterol 108 (90 BASE) MCG/ACT inhaler  Commonly known as:  PROVENTIL HFA;VENTOLIN HFA  Inhale 2 puffs into the lungs every 6 (six) hours as needed for wheezing or shortness of breath.     ALPRAZolam 0.5 MG 24 hr tablet  Commonly known as:  XANAX XR  Take 0.5 mg by mouth daily.     cephALEXin 500 MG capsule  Commonly known as:  KEFLEX  Take 1 capsule (500 mg total) by mouth 2 (two) times daily.     Dapsone 5 % topical gel  Commonly known as:  ACZONE  Apply 1 application topically 2 (two) times daily.     FLUoxetine 40 MG capsule  Commonly known as:  PROZAC  Take 60 mg by mouth every morning.     levothyroxine 50 MCG tablet  Commonly known as:  SYNTHROID, LEVOTHROID  Take 1 tablet (50 mcg total) by mouth daily before breakfast.     mupirocin ointment 2 %  Commonly known as:  BACTROBAN  Place 1 application into the nose 2 (two) times daily.     oxyCODONE-acetaminophen 7.5-325 MG per tablet  Commonly known as:  PERCOCET  Take 1 tablet by mouth every 4 (four) hours as needed for pain.     zolpidem 12.5 MG CR tablet  Commonly known as:  AMBIEN CR  Take 1 tablet (12.5 mg total) by mouth at bedtime.      ASK your doctor about these medications       norethindrone-ethinyl  estradiol 1-20 MG-MCG tablet  Commonly known as:  JUNEL FE,GILDESS FE,LOESTRIN FE  Take 1 tablet by mouth daily.           Follow-up Information   Follow up with Macauley Mossberg,MARIE-LYNE, MD In 3 weeks.   Specialty:  Obstetrics and Gynecology   Contact information:   [address] [place] [address] [phone]       Signed: Princess Bruins, MD 09/18/2013, 8:08 AM

## 2013-09-18 NOTE — Progress Notes (Signed)
R PICC D/C per MD order 40cm intact. Vaseline pressure dsg applied/pressure x5 min/no bleeding. Lorri Frederick, RN

## 2013-09-24 ENCOUNTER — Encounter (HOSPITAL_COMMUNITY): Payer: Self-pay | Admitting: Emergency Medicine

## 2013-09-24 ENCOUNTER — Emergency Department (HOSPITAL_COMMUNITY)
Admission: EM | Admit: 2013-09-24 | Discharge: 2013-09-24 | Disposition: A | Discharge status: Home / Self Care | Payer: 59 | Attending: Emergency Medicine | Admitting: Emergency Medicine

## 2013-09-24 DIAGNOSIS — Z8742 Personal history of other diseases of the female genital tract: Secondary | ICD-10-CM | POA: Insufficient documentation

## 2013-09-24 DIAGNOSIS — F1320 Sedative, hypnotic or anxiolytic dependence, uncomplicated: Secondary | ICD-10-CM | POA: Insufficient documentation

## 2013-09-24 DIAGNOSIS — Z872 Personal history of diseases of the skin and subcutaneous tissue: Secondary | ICD-10-CM | POA: Insufficient documentation

## 2013-09-24 DIAGNOSIS — R197 Diarrhea, unspecified: Secondary | ICD-10-CM | POA: Insufficient documentation

## 2013-09-24 DIAGNOSIS — F13239 Sedative, hypnotic or anxiolytic dependence with withdrawal, unspecified: Secondary | ICD-10-CM

## 2013-09-24 DIAGNOSIS — Z79899 Other long term (current) drug therapy: Secondary | ICD-10-CM | POA: Insufficient documentation

## 2013-09-24 DIAGNOSIS — Z8719 Personal history of other diseases of the digestive system: Secondary | ICD-10-CM | POA: Insufficient documentation

## 2013-09-24 DIAGNOSIS — G8929 Other chronic pain: Secondary | ICD-10-CM | POA: Insufficient documentation

## 2013-09-24 DIAGNOSIS — F329 Major depressive disorder, single episode, unspecified: Secondary | ICD-10-CM | POA: Insufficient documentation

## 2013-09-24 DIAGNOSIS — F3289 Other specified depressive episodes: Secondary | ICD-10-CM | POA: Insufficient documentation

## 2013-09-24 DIAGNOSIS — E079 Disorder of thyroid, unspecified: Secondary | ICD-10-CM | POA: Insufficient documentation

## 2013-09-24 DIAGNOSIS — Z862 Personal history of diseases of the blood and blood-forming organs and certain disorders involving the immune mechanism: Secondary | ICD-10-CM | POA: Insufficient documentation

## 2013-09-24 DIAGNOSIS — Z91040 Latex allergy status: Secondary | ICD-10-CM | POA: Insufficient documentation

## 2013-09-24 DIAGNOSIS — R110 Nausea: Secondary | ICD-10-CM | POA: Insufficient documentation

## 2013-09-24 DIAGNOSIS — M549 Dorsalgia, unspecified: Secondary | ICD-10-CM | POA: Insufficient documentation

## 2013-09-24 DIAGNOSIS — F411 Generalized anxiety disorder: Secondary | ICD-10-CM | POA: Insufficient documentation

## 2013-09-24 DIAGNOSIS — F13939 Sedative, hypnotic or anxiolytic use, unspecified with withdrawal, unspecified: Secondary | ICD-10-CM

## 2013-09-24 DIAGNOSIS — Z8619 Personal history of other infectious and parasitic diseases: Secondary | ICD-10-CM | POA: Insufficient documentation

## 2013-09-24 DIAGNOSIS — F19939 Other psychoactive substance use, unspecified with withdrawal, unspecified: Principal | ICD-10-CM | POA: Insufficient documentation

## 2013-09-24 DIAGNOSIS — R42 Dizziness and giddiness: Secondary | ICD-10-CM | POA: Insufficient documentation

## 2013-09-24 DIAGNOSIS — Z8709 Personal history of other diseases of the respiratory system: Secondary | ICD-10-CM | POA: Insufficient documentation

## 2013-09-24 DIAGNOSIS — Z87442 Personal history of urinary calculi: Secondary | ICD-10-CM | POA: Insufficient documentation

## 2013-09-24 MED ORDER — LORAZEPAM 0.5 MG PO TABS: 0.5000 mg | ORAL_TABLET | Freq: Three times a day (TID) | ORAL | Status: DC | PRN

## 2013-09-24 MED ORDER — LORAZEPAM 1 MG PO TABS
1.0000 mg | ORAL_TABLET | Freq: Once | ORAL | Status: AC
  Administered 2013-09-24: 1 mg via ORAL
  Filled 2013-09-24: qty 1

## 2013-09-24 NOTE — ED Notes (Addendum)
Pt being sent by OB/GYN for possible Xanax and narcotic withdrawal.  C/o nausea.  Pt reports running out of Xanax x 4 days ago.  Hx of narcotic abuse.  Pt had procedure on 6/1.

## 2013-09-24 NOTE — ED Provider Notes (Signed)
CSN: [id number]     Arrival date & time 09/24/13  1309 History  This chart was scribed for non-physician practitioner, Carlisle Cater, PA-C, working with Merryl Hacker, MD by Roe Coombs, ED Scribe. This patient was seen in room [facility]4/[facility]4 and the patient's care was started at 1:36 PM.  Chief Complaint  Patient presents with  . Medical Clearance    The history is provided by the patient. No language interpreter was used.    HPI Comments: Mariah Reyes is a 43 y.o. female with history of depression who presents to the Emergency Department presents with withdrawal syndrome. She had a robotic total laparoscopic hysterectomy with bilateral salpingectomies on 09/17/13 and states that she was anxious prior to her procedure and began taking Xanax 0.5 mg 2 tablets per day instead of 1 tablet causing her to run out early - 4 days ago. She reports that she has been on Xanax for the past 10 years. Currently, patient is complaining of nausea, lightheadedness, diarrhea for the past 2 days. She also has a history of chronic back pain and takes oxycodone regularly. She feels her back pain has increased in the last couple of days. Patient further reports that she spoke to her psychiatrist recently who plans to prescribe medicines to help her ease through her withdrawal symptoms. Patient denies recent alcohol use.   Past Medical History  Diagnosis Date  . Skin disorder     lesion on forehead at eyebrow  . Pancreatitis   . Ovarian cyst   . Sinus disease   . Thyroid disease   . Anxiety   . Depression   . Dysmenorrhea   . Fibroid   . Scoliosis   . STD (sexually transmitted disease) [place]  . Urinary incontinence   . Dysthymia   . Gallstones   . Rectal bleeding   . Staph infection   . Sphincter of Oddi dysfunction   . Kidney stones   . Anemia   . Blood disorder 2009    "high Eos count for couple of years"   Past Surgical History  Procedure Laterality Date  . Cholecystectomy    . Back  surgery    . Bile duct exploration  2006 2008  . Umbilical hernia repair  2013  . 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 Dilation and curettage of uterus    . Essure tubal ligation  2007  . Robotic assisted total hysterectomy N/A 09/17/2013    Procedure: ROBOTIC ASSISTED TOTAL HYSTERECTOMY ;  Surgeon: Princess Bruins, MD;  Location: [facility];  Service: Gynecology;  Laterality: N/A;  . Bilateral salpingectomy Bilateral 09/17/2013    Procedure: BILATERAL SALPINGECTOMY;  Surgeon: Princess Bruins, MD;  Location: [facility];  Service: Gynecology;  Laterality: Bilateral;  . Abdominal hysterectomy     Family History  Problem Relation Age of Onset  . Hyperlipidemia Mother   . Hypertension Father   . Breast cancer Paternal Grandmother    History  Substance Use Topics  . Smoking status: Never Smoker   . Smokeless tobacco: Never Used  . Alcohol Use: Yes   OB History   Grav Para Term Preterm Abortions TAB SAB Ect Mult Living   5 1   4 3 1   1      Review of Systems  Constitutional: Negative for fever.  Respiratory: Negative for shortness of breath.   Cardiovascular: Negative for chest pain.  Gastrointestinal: Positive for nausea and diarrhea.  Musculoskeletal: Positive for  back pain.  Neurological: Positive for light-headedness.    Allergies  Latex; Nickel; Keflex; Ciprofloxacin hcl; and Doxycycline  Home Medications   Prior to Admission medications   Medication Sig Start Date End Date Taking? Authorizing Provider  albuterol (PROVENTIL HFA;VENTOLIN HFA) 108 (90 BASE) MCG/ACT inhaler Inhale 2 puffs into the lungs every 6 (six) hours as needed for wheezing or shortness of breath.   Yes Historical Provider, MD  ALPRAZolam (XANAX XR) 0.5 MG 24 hr tablet Take 0.5 mg by mouth daily.   Yes Historical Provider, MD  Dapsone (ACZONE) 5 % topical gel Apply 1 application topically 2 (two) times daily. 07/30/13  Yes Nena Polio, PA-C  FLUoxetine (PROZAC) 40 MG capsule Take 60 mg by mouth every  morning. 07/30/13  Yes Nena Polio, PA-C  levothyroxine (SYNTHROID, LEVOTHROID) 50 MCG tablet Take 1 tablet (50 mcg total) by mouth daily before breakfast. 07/30/13  Yes Nena Polio, PA-C  Multiple Vitamin (MULTIVITAMIN WITH MINERALS) TABS tablet Take 1 tablet by mouth daily.   Yes Historical Provider, MD  mupirocin ointment (BACTROBAN) 2 % Place 1 application into the nose 2 (two) times daily.   Yes Historical Provider, MD  zolpidem (AMBIEN CR) 12.5 MG CR tablet Take 1 tablet (12.5 mg total) by mouth at bedtime. 07/30/13  Yes Nena Polio, PA-C   Physical Exam  Nursing note and vitals reviewed. Constitutional: She appears well-developed and well-nourished. No distress.  HENT:  Head: Normocephalic and atraumatic.  Eyes: Conjunctivae and EOM are normal.  Neck: Normal range of motion. No tracheal deviation present.  Cardiovascular: Normal rate, regular rhythm and normal heart sounds.  Exam reveals no gallop and no friction rub.   No murmur heard. Pulmonary/Chest: Effort normal and breath sounds normal. No respiratory distress. She has no wheezes. She has no rales.  Abdominal: There is tenderness. There is no rebound and no guarding.  Healing recent surgical scars without apparent complication lower abd  Musculoskeletal: Normal range of motion.  Neurological: She is alert.  Patient is tremulous  Skin: Skin is warm and dry.  Psychiatric: Her behavior is normal. Her mood appears anxious.    ED Course  Procedures (including critical care time) DIAGNOSTIC STUDIES: Oxygen Saturation is 96% on room air, normal by my interpretation.    COORDINATION OF CARE: 1:42 PM- Patient informed of current plan for treatment and evaluation and agrees with plan at this time.   Medications  LORazepam (ATIVAN) tablet 1 mg (1 mg Oral Given 09/24/13 1400)    BP 118/87  Pulse 81  Temp(Src) 98.5 F (36.9 C) (Oral)  Resp 22  SpO2 96%  LMP 07/29/2013  3:09 PM patient feeling much better after  administration of Ativan.  Will give patient prescription for Ativan #3. She is going to contact her psychiatrist after she leaves to discuss medications to help with withdrawals.  Patient urged to return with worsening symptoms or other concerns. Patient verbalized understanding and agrees with plan.    MDM   Final diagnoses:  Benzodiazepine withdrawal   Patient with benzodiazepine withdrawal. Symptoms improved in ED with Ativan. Patient has psychiatry followup. No indications for inpatient admission at this time. No SI/HI.  I personally performed the services described in this documentation, which was scribed in my presence. The recorded information has been reviewed and is accurate.    Carlisle Cater, PA-C 09/24/13 1510

## 2013-09-24 NOTE — ED Provider Notes (Signed)
Medical screening examination/treatment/procedure(s) were performed by non-physician practitioner and as supervising physician I was immediately available for consultation/collaboration.   EKG Interpretation None        Courtney F Horton, MD 09/24/13 1609 

## 2013-09-24 NOTE — ED Notes (Signed)
Pt states she possibly took too many over the past week.

## 2013-09-24 NOTE — Discharge Instructions (Signed)
Please read and follow all provided instructions.  Your diagnoses today include:  1. Benzodiazepine withdrawal     Tests performed today include:  Vital signs. See below for your results today.   Medications prescribed:   Ativan - to help ease withdrawal symptoms until you can get started on medication prescribed by psychiatrist  Take any prescribed medications only as directed.  Home care instructions:  Follow any educational materials contained in this packet.  Follow-up instructions: Please follow-up with your psychiatrist as soon as possibe for further evaluation of your symptoms. If you do not have a primary care doctor -- see below for referral information.   Return instructions:   Please return to the Emergency Department if you experience worsening symptoms.   Please return if you have any other emergent concerns.  Additional Information:  Your vital signs today were: BP 118/87   Pulse 81   Temp(Src) 98.5 F (36.9 C) (Oral)   Resp 22   SpO2 96%   LMP 07/29/2013 If your blood pressure (BP) was elevated above 135/85 this visit, please have this repeated by your doctor within one month. --------------

## 2013-11-08 ENCOUNTER — Other Ambulatory Visit: Payer: Self-pay

## 2013-11-17 ENCOUNTER — Encounter (HOSPITAL_COMMUNITY): Payer: Self-pay | Admitting: Emergency Medicine

## 2013-11-17 ENCOUNTER — Emergency Department (HOSPITAL_COMMUNITY)
Admission: EM | Admit: 2013-11-17 | Discharge: 2013-11-17 | Disposition: A | Discharge status: Home / Self Care | Payer: 59 | Attending: Emergency Medicine | Admitting: Emergency Medicine

## 2013-11-17 DIAGNOSIS — D649 Anemia, unspecified: Secondary | ICD-10-CM | POA: Insufficient documentation

## 2013-11-17 DIAGNOSIS — Z87442 Personal history of urinary calculi: Secondary | ICD-10-CM | POA: Insufficient documentation

## 2013-11-17 DIAGNOSIS — F411 Generalized anxiety disorder: Secondary | ICD-10-CM | POA: Diagnosis not present

## 2013-11-17 DIAGNOSIS — E079 Disorder of thyroid, unspecified: Secondary | ICD-10-CM | POA: Diagnosis not present

## 2013-11-17 DIAGNOSIS — R7402 Elevation of levels of lactic acid dehydrogenase (LDH): Secondary | ICD-10-CM | POA: Insufficient documentation

## 2013-11-17 DIAGNOSIS — Z008 Encounter for other general examination: Secondary | ICD-10-CM | POA: Insufficient documentation

## 2013-11-17 DIAGNOSIS — R21 Rash and other nonspecific skin eruption: Secondary | ICD-10-CM | POA: Diagnosis not present

## 2013-11-17 DIAGNOSIS — R740 Nonspecific elevation of levels of transaminase and lactic acid dehydrogenase [LDH]: Secondary | ICD-10-CM

## 2013-11-17 DIAGNOSIS — Z8739 Personal history of other diseases of the musculoskeletal system and connective tissue: Secondary | ICD-10-CM | POA: Insufficient documentation

## 2013-11-17 DIAGNOSIS — Z79899 Other long term (current) drug therapy: Secondary | ICD-10-CM | POA: Diagnosis not present

## 2013-11-17 DIAGNOSIS — F419 Anxiety disorder, unspecified: Secondary | ICD-10-CM

## 2013-11-17 DIAGNOSIS — Z8719 Personal history of other diseases of the digestive system: Secondary | ICD-10-CM | POA: Insufficient documentation

## 2013-11-17 DIAGNOSIS — J3489 Other specified disorders of nose and nasal sinuses: Secondary | ICD-10-CM | POA: Diagnosis not present

## 2013-11-17 DIAGNOSIS — F3289 Other specified depressive episodes: Secondary | ICD-10-CM | POA: Diagnosis not present

## 2013-11-17 DIAGNOSIS — R45851 Suicidal ideations: Secondary | ICD-10-CM | POA: Insufficient documentation

## 2013-11-17 DIAGNOSIS — Z872 Personal history of diseases of the skin and subcutaneous tissue: Secondary | ICD-10-CM | POA: Insufficient documentation

## 2013-11-17 DIAGNOSIS — F329 Major depressive disorder, single episode, unspecified: Secondary | ICD-10-CM | POA: Insufficient documentation

## 2013-11-17 DIAGNOSIS — Z3202 Encounter for pregnancy test, result negative: Secondary | ICD-10-CM | POA: Diagnosis not present

## 2013-11-17 DIAGNOSIS — IMO0002 Reserved for concepts with insufficient information to code with codable children: Secondary | ICD-10-CM | POA: Diagnosis not present

## 2013-11-17 DIAGNOSIS — Z8619 Personal history of other infectious and parasitic diseases: Secondary | ICD-10-CM | POA: Insufficient documentation

## 2013-11-17 DIAGNOSIS — Z8742 Personal history of other diseases of the female genital tract: Secondary | ICD-10-CM | POA: Diagnosis not present

## 2013-11-17 DIAGNOSIS — Z91040 Latex allergy status: Secondary | ICD-10-CM | POA: Diagnosis not present

## 2013-11-17 DIAGNOSIS — R7401 Elevation of levels of liver transaminase levels: Secondary | ICD-10-CM | POA: Insufficient documentation

## 2013-11-17 LAB — CBC WITH DIFFERENTIAL/PLATELET
Basophils Absolute: 0.1 10*3/uL (ref 0.0–0.1)
Basophils Relative: 1 % (ref 0–1)
Eosinophils Absolute: 0.4 10*3/uL (ref 0.0–0.7)
Eosinophils Relative: 3 % (ref 0–5)
HEMATOCRIT: 43.8 % (ref 36.0–46.0)
Hemoglobin: 14.2 g/dL (ref 12.0–15.0)
LYMPHS PCT: 20 % (ref 12–46)
Lymphs Abs: 2.7 10*3/uL (ref 0.7–4.0)
MCH: 29.9 pg (ref 26.0–34.0)
MCHC: 32.4 g/dL (ref 30.0–36.0)
MCV: 92.2 fL (ref 78.0–100.0)
Monocytes Absolute: 0.7 10*3/uL (ref 0.1–1.0)
Monocytes Relative: 5 % (ref 3–12)
NEUTROS PCT: 71 % (ref 43–77)
Neutro Abs: 9.6 10*3/uL — ABNORMAL HIGH (ref 1.7–7.7)
PLATELETS: 390 10*3/uL (ref 150–400)
RBC: 4.75 MIL/uL (ref 3.87–5.11)
RDW: 14.2 % (ref 11.5–15.5)
WBC: 13.4 10*3/uL — AB (ref 4.0–10.5)

## 2013-11-17 LAB — ACETAMINOPHEN LEVEL: Acetaminophen (Tylenol), Serum: <15 ug/mL (ref 10–30)

## 2013-11-17 LAB — COMPREHENSIVE METABOLIC PANEL
ALT: 150 U/L — AB (ref 0–35)
AST: 43 U/L — AB (ref 0–37)
Albumin: 4.7 g/dL (ref 3.5–5.2)
Alkaline Phosphatase: 177 U/L — ABNORMAL HIGH (ref 39–117)
Anion gap: 16 — ABNORMAL HIGH (ref 5–15)
BILIRUBIN TOTAL: 0.3 mg/dL (ref 0.3–1.2)
BUN: 13 mg/dL (ref 6–23)
CHLORIDE: 97 meq/L (ref 96–112)
CO2: 26 meq/L (ref 19–32)
CREATININE: 0.65 mg/dL (ref 0.50–1.10)
Calcium: 10.1 mg/dL (ref 8.4–10.5)
GFR calc Af Amer: >90 mL/min (ref >90)
Glucose, Bld: 92 mg/dL (ref 70–99)
POTASSIUM: 3.5 meq/L — AB (ref 3.7–5.3)
SODIUM: 139 meq/L (ref 137–147)
Total Protein: 8.7 g/dL — ABNORMAL HIGH (ref 6.0–8.3)

## 2013-11-17 LAB — RAPID URINE DRUG SCREEN, HOSP PERFORMED
Amphetamines: POSITIVE — AB
Barbiturates: NOT DETECTED
Benzodiazepines: POSITIVE — AB
COCAINE: NOT DETECTED
Opiates: NOT DETECTED
TETRAHYDROCANNABINOL: NOT DETECTED

## 2013-11-17 LAB — POC URINE PREG, ED: PREG TEST UR: NEGATIVE

## 2013-11-17 LAB — SALICYLATE LEVEL: Salicylate Lvl: <2 mg/dL — ABNORMAL LOW (ref 2.8–20.0)

## 2013-11-17 LAB — ETHANOL: Alcohol, Ethyl (B): <11 mg/dL (ref 0–11)

## 2013-11-17 MED ORDER — AMOXICILLIN 500 MG PO CAPS
500.0000 mg | ORAL_CAPSULE | Freq: Three times a day (TID) | ORAL | Status: DC
  Administered 2013-11-17: 500 mg via ORAL
  Filled 2013-11-17: qty 1

## 2013-11-17 MED ORDER — ONDANSETRON HCL 4 MG PO TABS: 4.0000 mg | ORAL_TABLET | Freq: Three times a day (TID) | ORAL | Status: DC | PRN

## 2013-11-17 MED ORDER — FLUTICASONE PROPIONATE HFA 110 MCG/ACT IN AERO
2.0000 | INHALATION_SPRAY | Freq: Two times a day (BID) | RESPIRATORY_TRACT | Status: DC
  Administered 2013-11-17: 2 via RESPIRATORY_TRACT
  Filled 2013-11-17: qty 12

## 2013-11-17 MED ORDER — ADULT MULTIVITAMIN W/MINERALS CH
1.0000 | ORAL_TABLET | Freq: Every day | ORAL | Status: DC
  Administered 2013-11-17: 1 via ORAL
  Filled 2013-11-17: qty 1

## 2013-11-17 MED ORDER — ZOLPIDEM TARTRATE 5 MG PO TABS
5.0000 mg | ORAL_TABLET | Freq: Every evening | ORAL | Status: DC | PRN
  Administered 2013-11-17: 5 mg via ORAL
  Filled 2013-11-17: qty 1

## 2013-11-17 MED ORDER — LORAZEPAM 1 MG PO TABS
1.0000 mg | ORAL_TABLET | Freq: Three times a day (TID) | ORAL | Status: DC | PRN
  Administered 2013-11-17: 1 mg via ORAL
  Filled 2013-11-17: qty 1

## 2013-11-17 MED ORDER — IBUPROFEN 200 MG PO TABS: 600.0000 mg | ORAL_TABLET | Freq: Three times a day (TID) | ORAL | Status: DC | PRN

## 2013-11-17 MED ORDER — DIPHENHYDRAMINE HCL 25 MG PO CAPS: 25.0000 mg | ORAL_CAPSULE | Freq: Four times a day (QID) | ORAL | Status: DC | PRN

## 2013-11-17 MED ORDER — LEVOTHYROXINE SODIUM 50 MCG PO TABS
50.0000 ug | ORAL_TABLET | Freq: Every day | ORAL | Status: DC
  Filled 2013-11-17: qty 1

## 2013-11-17 MED ORDER — FLUTICASONE PROPIONATE 50 MCG/ACT NA SUSP
2.0000 | Freq: Every day | NASAL | Status: DC
  Filled 2013-11-17: qty 16

## 2013-11-17 MED ORDER — ALBUTEROL SULFATE HFA 108 (90 BASE) MCG/ACT IN AERS: 2.0000 | INHALATION_SPRAY | Freq: Four times a day (QID) | RESPIRATORY_TRACT | Status: DC | PRN

## 2013-11-17 MED ORDER — FLUOXETINE HCL 20 MG/5ML PO SOLN
60.0000 mg | Freq: Every day | ORAL | Status: DC
  Administered 2013-11-17: 60 mg via ORAL
  Filled 2013-11-17: qty 15

## 2013-11-17 MED ORDER — ACETAMINOPHEN 325 MG PO TABS
650.0000 mg | ORAL_TABLET | ORAL | Status: DC | PRN
Start: 2013-11-17 — End: 2013-11-18

## 2013-11-17 NOTE — Consult Note (Signed)
  TTS assess-pt presents with multiple MEDICAL complaints including "taking too much of my pain medicine (oxycodone)" and stating she woukld be suicidal if they stopped  her meds.? No current HI SI.Wanting ED to addres her dermatolgy and stool sample and tic bite questions as well. 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 Results Review reveals benzos and amphetamine in UDS NO OPiates!Pt claims to be put on Adderall -not listed in Meds.She appears to have prescriptions for multiple psycohactive addictive substances and is apparently out of oxycodone a la her drug screen.  Recommend she be discharged to home to followup with her prescribing and treating providers.

## 2013-11-17 NOTE — ED Provider Notes (Addendum)
Medical screening examination/treatment/procedure(s) were performed by non-physician practitioner and as supervising physician I was immediately available for consultation/collaboration.  9:37 PM TTS has evaluated patient and states she is not currently suicidal.  She states to them that she can't be sure how she will feel next week.  On review of labs her LFTs are elevated, pt has no c/o abdominal pain or vomiting.  She will need to have these labs rechecked.     EKG Interpretation None       Threasa Beards, MD 11/17/13 2133  Threasa Beards, MD 11/17/13 2138

## 2013-11-17 NOTE — ED Notes (Signed)
Pt now complaining of SI. Pt fidgety. Benzo detox.

## 2013-11-17 NOTE — BH Assessment (Signed)
Assessment completed. Consulted with Darlyne Russian, PA-C who recommended that patient follow up with her outpatient provider due to not meeting inpatient criteria. Dr. Canary Brim has been notified of the recommendations.

## 2013-11-17 NOTE — BH Assessment (Signed)
Assessment Note  Mariah Reyes is an 43 y.o. female who presents to [facility] ED due to having skin lesions and a sinus infection. Pt also reported that at times reporting suicidal ideations.  Pt denies SI at this time but stated "I don't know how I will feel in the next two days or next week". Pt reported that she has attempted suicide in the past by taking sleeping medications. Pt has been hospitalized several times due to suicidal gestures and ideations. Pt is currently receiving mental health and medication management from [facility]. Pt reported that her facial lesions are a stressor for her and stated "I don't want to keep getting the sores". Pt reported that she recently had a biopsy completed by her dermatologist who concluded that the lesions were results of bug bites or an allergic reaction to medication. Pt stated "I'm afraid if I have to go off of my medication I will be suicidal". Pt reported that "I feel like a leopard, I feel trapped". Pt is endorsing depressive symptoms and stated "I feel frustrated and sad". Pt reported that she sleeps for 5-6 hours at night and has a poor appetite. Pt denies HI and AVH at this time. Pt reported that she owns a handgun. Pt reported that earlier this week she was unsure if she was hallucination or not when she noticed "white fibers" coming out of her stool and white fibers coming out of tick that she pulled from her hair approximately one week ago. Pt did not report any criminal charges or upcoming court dates. Pt did not report any illicit substance use and reported that she drinks alcohol socially. Pt reported that she has a strong support system which includes her mother, husband and two sisters. 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 Pt did not report any physical, sexual or emotional abuse at this time.   Axis I: Major Depression, Recurrent severe Axis II: No diagnosis Axis III:  Past  Medical History  Diagnosis Date  . Skin disorder     lesion on forehead at eyebrow  . Pancreatitis   . Ovarian cyst   . Sinus disease   . Thyroid disease   . Anxiety   . Depression   . Dysmenorrhea   . Fibroid   . Scoliosis   . STD (sexually transmitted disease) [place]  . Urinary incontinence   . Dysthymia   . Gallstones   . Rectal bleeding   . Staph infection   . Sphincter of Oddi dysfunction   . Kidney stones   . Anemia   . Blood disorder 2009    "high Eos count for couple of years"   Axis IV: other psychosocial or environmental problems Axis V: 51-60 moderate symptoms  Past Medical History:  Past Medical History  Diagnosis Date  . Skin disorder     lesion on forehead at eyebrow  . Pancreatitis   . Ovarian cyst   . Sinus disease   . Thyroid disease   . Anxiety   . Depression   . Dysmenorrhea   . Fibroid   . Scoliosis   . STD (sexually transmitted disease) [place]  . Urinary incontinence   . Dysthymia   . Gallstones   . Rectal bleeding   . Staph infection   . Sphincter of Oddi dysfunction   . Kidney stones   . Anemia   .  Blood disorder 2009    "high Eos count for couple of years"    Past Surgical History  Procedure Laterality Date  . Cholecystectomy    . Back surgery    . Bile duct exploration  2006 2008  . Umbilical hernia repair  2013  . 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 Dilation and curettage of uterus    . Essure tubal ligation  2007  . Robotic assisted total hysterectomy N/A 09/17/2013    Procedure: ROBOTIC ASSISTED TOTAL HYSTERECTOMY ;  Surgeon: Princess Bruins, MD;  Location: [facility];  Service: Gynecology;  Laterality: N/A;  . Bilateral salpingectomy Bilateral 09/17/2013    Procedure: BILATERAL SALPINGECTOMY;  Surgeon: Princess Bruins, MD;  Location: [facility];  Service: Gynecology;  Laterality: Bilateral;  . Abdominal hysterectomy      Family History:  Family History  Problem Relation Age of Onset  . Hyperlipidemia  Mother   . Hypertension Father   . Breast cancer Paternal Grandmother     Social History:  reports that she has never smoked. She has never used smokeless tobacco. She reports that she drinks alcohol. She reports that she does not use illicit drugs.  Additional Social History:  Alcohol / Drug Use History of alcohol / drug use?: No history of alcohol / drug abuse  CIWA: CIWA-Ar BP: 116/90 mmHg Pulse Rate: 87 COWS:    Allergies:  Allergies  Allergen Reactions  . Latex Itching and Rash    Reactivated asthma  . Nickel Itching, Swelling and Rash  . Keflex [Cephalexin]     Able to take with Benadryl  . Ciprofloxacin Hcl Hives and Rash  . Doxycycline Rash    Home Medications:  (Not in a hospital admission)  OB/GYN Status:  Patient's last menstrual period was 07/29/2013.  General Assessment Data Location of Assessment: [facility] ED Is this a Tele or Face-to-Face Assessment?: Face-to-Face Is this an Initial Assessment or a Re-assessment for this encounter?: Initial Assessment Living Arrangements: Children;Spouse/significant other Can pt return to current living arrangement?: Yes Admission Status: Voluntary Is patient capable of signing voluntary admission?: Yes Transfer from: Home Referral Source: Self/Family/Friend     [place] Living Arrangements: Children;Spouse/significant other Name of Psychiatrist: Noemi Chapel  ([place] ) Name of Therapist: Talmage Nap  Education Status Is patient currently in school?: No Current Grade: NA Highest grade of school patient has completed: [employer]) Name of school: NA Contact person: NA  Risk to self with the past 6 months Suicidal Ideation: Yes-Currently Present Suicidal Intent: No-Not Currently/Within Last 6 Months Is patient at risk for suicide?: No Suicidal Plan?: No Access to Means: No What has been your use of drugs/alcohol within the last 12 months?: No alcohol  or drug use reported Previous Attempts/Gestures: Yes How many times?: 1 Other Self Harm Risks: No other self harm risk identified at this time.  Triggers for Past Attempts: None known Intentional Self Injurious Behavior: None Family Suicide History: No Recent stressful life event(s): Other (Comment) ("Skin lesions" ) Persecutory voices/beliefs?: No Depression: Yes Depression Symptoms: Despondent;Tearfulness;Isolating;Fatigue;Guilt;Loss of interest in usual pleasures Substance abuse history and/or treatment for substance abuse?: No  Risk to Others within the past 6 months Homicidal Ideation: No Thoughts of Harm to Others: No Current Homicidal Intent: No Current Homicidal Plan: No Access to Homicidal Means: No Identified Victim: NA History of harm to others?: No Assessment of Violence: None Noted Violent Behavior Description: No violent behaviors observed. Pt  is calm and cooperative.  Does patient have access to weapons?: Yes (Comment) (Pt has a handgun at home. ) Criminal Charges Pending?: No Does patient have a court date: No  Psychosis Hallucinations: None noted Delusions: None noted  Mental Status Report Appear/Hygiene: In scrubs Eye Contact: Poor Motor Activity: Freedom of movement Speech: Logical/coherent Level of Consciousness: Alert Mood: Depressed;Sad Affect: Depressed;Sad Anxiety Level: Minimal Thought Processes: Coherent;Relevant Judgement: Unimpaired Orientation: Person;Place;Situation;Time Obsessive Compulsive Thoughts/Behaviors: None  Cognitive Functioning Concentration: Fair Memory: Recent Intact IQ: Average Insight: Fair Impulse Control: Good Appetite: Poor Weight Loss: 0 Weight Gain: 0 Sleep: Decreased Total Hours of Sleep: 5 Vegetative Symptoms: Staying in bed;Not bathing;Decreased grooming  ADLScreening [facility] Assessment Services) Patient's cognitive ability adequate to safely complete daily activities?: Yes Patient able to express need for  assistance with ADLs?: Yes Independently performs ADLs?: Yes (appropriate for developmental age)  Prior Inpatient Therapy Prior Inpatient Therapy: Yes Prior Therapy Dates: 2012; 2015 Prior Therapy Facilty/Provider(s): [place] [facility] Reason for Treatment: Suicidal/Depression   Prior Outpatient Therapy Prior Outpatient Therapy: Yes Prior Therapy Dates: 2015 Prior Therapy Facilty/Provider(s): [place]  Reason for Treatment: Depression; anxiety   ADL Screening (condition at time of admission) Patient's cognitive ability adequate to safely complete daily activities?: Yes Is the patient deaf or have difficulty hearing?: No Does the patient have difficulty seeing, even when wearing glasses/contacts?: No Does the patient have difficulty concentrating, remembering, or making decisions?: No Patient able to express need for assistance with ADLs?: Yes Does the patient have difficulty dressing or bathing?: No Independently performs ADLs?: Yes (appropriate for developmental age) Does the patient have difficulty walking or climbing stairs?: No  Home Assistive Devices/Equipment Home Assistive Devices/Equipment: None    Abuse/Neglect Assessment (Assessment to be complete while patient is alone) Physical Abuse: Denies Verbal Abuse: Denies Sexual Abuse: Denies Exploitation of patient/patient's resources: Denies Self-Neglect: Denies Values / Beliefs Cultural Requests During Hospitalization: None Spiritual Requests During Hospitalization: None        Additional Information 1:1 In Past 12 Months?: No CIRT Risk: No Elopement Risk: No     Disposition:  Disposition Initial Assessment Completed for this Encounter: Yes Disposition of Patient: Outpatient treatment Type of outpatient treatment: Adult  On Site Evaluation by:   Reviewed with Physician:    Kandis Ban 11/17/2013 9:44 PM

## 2013-11-17 NOTE — Discharge Instructions (Signed)
Return to the ED with any concerns including thoughts or feelings of suicide or homicide, abdominal pain, fever/chills, vomiting, decreased level of alertness/lethargy, or any other alatming symptoms  During your medical workup your liver tests were found to be elevated above normal- these will need to be rechecked by your primary care doctor in the next week.

## 2013-11-17 NOTE — ED Provider Notes (Signed)
CSN: [id number]     Arrival date & time 11/17/13  1521 History  This chart was scribed for non-physician practitioner, Zacarias Pontes, PA-C,working with Threasa Beards, MD, by Marlowe Kays, ED Scribe. This patient was seen in room [place] and the patient's care was started at 5:24 PM.  Chief Complaint  Patient presents with  . Nasal Congestion  . Facial Pain  . Medical Clearance   The history is provided by the patient. No language interpreter was used.   LEVEL 5 CAVEAT- Full history could not be obtained due to paranoia and tangentiality, pt somewhat uncooperative with questioning and with multiple chronic complaints.  HPI Comments:  Mariah Reyes is a 43 y.o. female with PMH including skin lesions, sinus disease, depression and anxiety who presents to the Emergency Department complaining of nasal congestion onset unknown. She reports associated sore throat, sinus pressure, and yellowish-green mucus when blowing her nose. She reports an intermittent productive cough of green mucus. Pt denies fevers, CP, SOB, abd pain, N/V/D, trismus, drooling, or neck pain.  She also reports worsening facial lesions for the past two weeks. She states the lesions start as white pimples and grow until they burst and turn a dark color and then peel off. She states they are painful and bleed at times. She states she has had the lesions for a couple of years but recently traveled to [place] two weeks ago where they became worse. She states she has seen a dermatologist for the issue and prescribed Dapsone cream. Pt denies fever or red streaking of the skin.   She is concerned that she may be having an allergic reaction to one of her medications, or need to come off of them because she's been taking more of her pain medication than she's supposed to and feels like she's having withdrawals (rhinorrhea, sweating, one occurrence of diarrhea). She states she has suicidal ideations when she thinks that she  may have to come off of her pain medication. Pt reports a suicide attempt last year of taking several sleeping pills. She denies a current plan. She denies h/o seizures. She reports that she was recently prescribed Adderall and Neurontin, unknown when she started taking these, but they have not been updated in the system. She denies alcohol abuse, but reports she drank a glass of wine with dinner the "other night". She denies any illicit drug use or IV drug use.  She also presents with a tick in a ziploc bag stating she pulled it from her head approximately one week ago. She states she saw "white string-like things" coming from the tick. She also has a stool sample with her stating she saw similar "white strings" in her stool. She denies hearing voices. She states she is not sure if the white strings are hallucinations or if they are really present. Reports that she thought she saw her stool moving, but isn't sure if this was a hallucination either. Denies delusional thoughts, but does endorse that she feels that there are things inside her body that are coming out from her skin, and doesn't know if this is real or not.    Past Medical History  Diagnosis Date  . Skin disorder     lesion on forehead at eyebrow  . Pancreatitis   . Ovarian cyst   . Sinus disease   . Thyroid disease   . Anxiety   . Depression   . Dysmenorrhea   . Fibroid   . Scoliosis   . STD (  sexually transmitted disease) [place]  . Urinary incontinence   . Dysthymia   . Gallstones   . Rectal bleeding   . Staph infection   . Sphincter of Oddi dysfunction   . Kidney stones   . Anemia   . Blood disorder 2009    "high Eos count for couple of years"   Past Surgical History  Procedure Laterality Date  . Cholecystectomy    . Back surgery    . Bile duct exploration  2006 2008  . Umbilical hernia repair  2013  . 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 Dilation and curettage of uterus    . Essure tubal ligation   2007  . Robotic assisted total hysterectomy N/A 09/17/2013    Procedure: ROBOTIC ASSISTED TOTAL HYSTERECTOMY ;  Surgeon: Princess Bruins, MD;  Location: [facility];  Service: Gynecology;  Laterality: N/A;  . Bilateral salpingectomy Bilateral 09/17/2013    Procedure: BILATERAL SALPINGECTOMY;  Surgeon: Princess Bruins, MD;  Location: [facility];  Service: Gynecology;  Laterality: Bilateral;  . Abdominal hysterectomy     Family History  Problem Relation Age of Onset  . Hyperlipidemia Mother   . Hypertension Father   . Breast cancer Paternal Grandmother    History  Substance Use Topics  . Smoking status: Never Smoker   . Smokeless tobacco: Never Used  . Alcohol Use: Yes   OB History   Grav Para Term Preterm Abortions TAB SAB Ect Mult Living   5 1   4 3 1   1      Review of Systems  Constitutional: Negative for fever.  HENT: Positive for congestion, rhinorrhea and sinus pressure. Negative for drooling and ear pain.   Respiratory: Positive for cough. Negative for shortness of breath.   Gastrointestinal: Negative for abdominal pain.  Genitourinary: Negative for hematuria.  Musculoskeletal: Negative for arthralgias and myalgias.  Skin: Positive for rash.  Psychiatric/Behavioral: Positive for suicidal ideas. Negative for hallucinations. The patient is nervous/anxious.   LEVEL 5 CAVEAT- Full history could not be obtained due to paranoia and tangentiality.   Allergies  Latex; Nickel; Keflex; Ciprofloxacin hcl; and Doxycycline  Home Medications   Prior to Admission medications   Medication Sig Start Date End Date Taking? Authorizing Provider  albuterol (PROVENTIL HFA;VENTOLIN HFA) 108 (90 BASE) MCG/ACT inhaler Inhale 2 puffs into the lungs every 6 (six) hours as needed for wheezing or shortness of breath.   Yes Historical Provider, MD  ALPRAZolam (XANAX XR) 0.5 MG 24 hr tablet Take 0.5 mg by mouth 2 (two) times daily.    Yes Historical Provider, MD  diphenhydrAMINE (BENADRYL) 25 mg capsule  Take 25 mg by mouth every 6 (six) hours as needed (itching).   Yes Historical Provider, MD  FLUoxetine (PROZAC) 40 MG capsule Take 60 mg by mouth every morning. 07/30/13  Yes Neil Mashburn, PA-C  fluticasone (FLOVENT HFA) 110 MCG/ACT inhaler Inhale 2 puffs into the lungs 2 (two) times daily.   Yes Historical Provider, MD  levothyroxine (SYNTHROID, LEVOTHROID) 50 MCG tablet Take 1 tablet (50 mcg total) by mouth daily before breakfast. 07/30/13  Yes Nena Polio, PA-C  Multiple Vitamin (MULTIVITAMIN WITH MINERALS) TABS tablet Take 1 tablet by mouth daily.   Yes Historical Provider, MD  oxyCODONE-acetaminophen (PERCOCET) 10-325 MG per tablet Take 1 tablet by mouth every 4 (four) hours as needed for pain (pain).   Yes Historical Provider, MD  zolpidem (AMBIEN CR) 12.5 MG CR tablet Take 1 tablet (  12.5 mg total) by mouth at bedtime. 07/30/13  Yes Nena Polio, PA-C   Triage Vitals: BP 116/90  Pulse 87  Resp 20  SpO2 100%  LMP 07/29/2013 Physical Exam  Nursing note and vitals reviewed. Constitutional: She is oriented to person, place, and time. Vital signs are normal. She appears well-developed and well-nourished.  Non-toxic appearance. No distress.  Afebrile, NAD, very nervous and fidgity  HENT:  Head: Normocephalic and atraumatic.  Right Ear: Hearing, tympanic membrane, external ear and ear canal normal.  Left Ear: Hearing, tympanic membrane, external ear and ear canal normal.  Nose: Mucosal edema, rhinorrhea and sinus tenderness present. Right sinus exhibits maxillary sinus tenderness and frontal sinus tenderness. Left sinus exhibits maxillary sinus tenderness and frontal sinus tenderness.  Mouth/Throat: Uvula is midline, oropharynx is clear and moist and mucous membranes are normal. No trismus in the jaw. No uvula swelling.  [place]/AT, no scalp lesions. Ears clear bilaterally. Bilateral nasal turbinates erythematous and edematous, with clear rhinorrhea. Posterior oropharynx clear with no tonsillar or  uvular swelling, no tonsillar exudates, no peritonsillar abscess. No trismus. Handling secretions well. Sinuses tender to palpation in all areas.  Eyes: Conjunctivae, EOM and lids are normal. Pupils are equal, round, and reactive to light.  Neck: Normal range of motion. Neck supple.  Cardiovascular: Normal rate, regular rhythm, normal heart sounds and intact distal pulses.  Exam reveals no gallop and no friction rub.   No murmur heard. Pulmonary/Chest: Effort normal and breath sounds normal. No respiratory distress. She has no decreased breath sounds. She has no wheezes. She has no rhonchi. She has no rales.  CTAB in all lung fields  Abdominal: Soft. Normal appearance and bowel sounds are normal. She exhibits no distension. There is no tenderness. There is no rigidity, no rebound, no guarding and no CVA tenderness.  Musculoskeletal: Normal range of motion.  Review all extremities at the knees. Gait within normal limits. No obvious swelling or deformity to any major joints.  Lymphadenopathy:       Head (right side): No submental, no submandibular and no tonsillar adenopathy present.       Head (left side): No submental, no submandibular and no tonsillar adenopathy present.    She has no cervical adenopathy.  No tender lymphadenopathy.  Neurological: She is alert and oriented to person, place, and time. Gait normal.  Skin: Skin is warm and dry. Lesion noted. No rash noted.  Multiple facial lesions, which appear to be erythematous and covered with a cream. They are nontender to palpation. There is no fluctuant or induration. No red streaking. All the areas are approximately 1-2 cm in diameter. They're not draining.no rashes on remaining areas of exposed surfaces  Psychiatric: Her mood appears anxious. Her speech is tangential. Thought content is paranoid and delusional. She expresses suicidal ideation. She expresses no homicidal ideation. She expresses no suicidal plans and no homicidal plans.   Appears very anxious, fidgeting throughout the exam. Speech is very tangential, making it difficult to assess what the patient is here for her. She is very inattentive, and loses focus easily. Patient had to be redirected several times during the exam. She does have some paranoid and delusional thoughts, although she does not endorse that these feel paranoid or delusional. She has had suicidal ideations, but does not have a plan of suicide currently. No homicidal ideations or plans. She is inattentive.    ED Course  Procedures (including critical care time) DIAGNOSTIC STUDIES: Oxygen Saturation is 100% on RA, normal by  my interpretation.   COORDINATION OF CARE: 5:49 PM- Will order psychiatric evaluation and standard medical clearance labs. Will order antibiotic for tick exposure and Flonase for sinus issues. Pt verbalizes understanding and agrees to plan.  Medications  LORazepam (ATIVAN) tablet 1 mg (1 mg Oral Given 11/17/13 2105)  acetaminophen (TYLENOL) tablet 650 mg (not administered)  ibuprofen (ADVIL,MOTRIN) tablet 600 mg (not administered)  zolpidem (AMBIEN) tablet 5 mg (not administered)  ondansetron (ZOFRAN) tablet 4 mg (not administered)  amoxicillin (AMOXIL) capsule 500 mg (500 mg Oral Given 11/17/13 2105)  fluticasone (FLONASE) 50 MCG/ACT nasal spray 2 spray (not administered)  albuterol (PROVENTIL HFA;VENTOLIN HFA) 108 (90 BASE) MCG/ACT inhaler 2 puff (not administered)  fluticasone (FLOVENT HFA) 110 MCG/ACT inhaler 2 puff (2 puffs Inhalation Given 11/17/13 2108)  levothyroxine (SYNTHROID, LEVOTHROID) tablet 50 mcg (not administered)  multivitamin with minerals tablet 1 tablet (1 tablet Oral Given 11/17/13 2105)  zolpidem (AMBIEN) tablet 5 mg (5 mg Oral Given 11/17/13 2105)  diphenhydrAMINE (BENADRYL) capsule 25 mg (not administered)  FLUoxetine (PROZAC) 20 MG/5ML solution 60 mg (60 mg Oral Given 11/17/13 2105)    Labs Review Labs Reviewed  CBC WITH DIFFERENTIAL - Abnormal; Notable  for the following:    WBC 13.4 (*)    Neutro Abs 9.6 (*)    All other components within normal limits  COMPREHENSIVE METABOLIC PANEL - Abnormal; Notable for the following:    Potassium 3.5 (*)    Total Protein 8.7 (*)    AST 43 (*)    ALT 150 (*)    Alkaline Phosphatase 177 (*)    Anion gap 16 (*)    All other components within normal limits  URINE RAPID DRUG SCREEN (HOSP PERFORMED) - Abnormal; Notable for the following:    Benzodiazepines POSITIVE (*)    Amphetamines POSITIVE (*)    All other components within normal limits  SALICYLATE LEVEL - Abnormal; Notable for the following:    Salicylate Lvl <8.8 (*)    All other components within normal limits  ETHANOL  ACETAMINOPHEN LEVEL  HEPATITIS PANEL, ACUTE  POC URINE PREG, ED    Imaging Review No results found.   EKG Interpretation None      MDM   Final diagnoses:  None    Pt with multiple complaints. Most concerning complaint is suicidal ideations and need for psych eval, per the patient's request. Will get TTS consult and basic labs and transfer to the psych area. Sitter placed at bedside. Will start home meds aside from the adderall which is not in the system, and her xanax since the pt has PRN orders for ativan if necessary. Other treatments as below: - Tick exposure: allergic to doxy, will start amoxicillin 575m TID x14 days. Has tick in her possession. No obvious arthritis or erythema migrans but given the exposure and skin lesions, will treat for it. Pt will need to go home with amox rx. - Sinus pressure: will start flonase now and d/c home with rx. Discussed symptomatic treatment of URI, and amox might help with any possible bacterial infection. Afebrile, nontoxic. No need for further work up for sinusitis. - Pt has been eval'd and tx'd for her facial lesions, states it's improving with dapsone cream, and there is no acute issue at this time. Will not further eval or treat this issue  8:00 PM Labs returning with  the following abnormalities: - UDS positive for benzos (pt prescribed xanax) and amphetamines (pt states she's rx'd adderall but this is not in  the system) - WBC mildly elevated at 13.4, pt afebrile and nontoxic, this is likely related to possible URI or stress response, unconcerning at this time given lack of a source. - K 3.5, no treatment required at this time, pt asymptomatic and not low enough to cause issues - AST/ALT elevations 43/150 respectively, likely related to her chronic overconsumption of percocet, but given her tangentiality and inability to get a full story, will obtain hepatitis panel to r/o acute hepatitis. Deritis ratio would not indicate that this is viral hepatitis, and the levels are not terribly elevated, but will proceed with hep panel.  - Alk phos 177, no RUQ tenderness on exam and no abd pain reported, no clinical significance at this time, unless pt develops abd pain.  TTS called and stated that pt was semi-uncooperative with their consult, and that they would be calling the behavioral specialist for recommendations regarding her care. Please see their dictation for further documentation. Care signed out to Dr. Artis Delay, MD, who will follow up with TTS/psych consult. He is aware of the treatment plan as it is laid out above.  I personally performed the services described in this documentation, which was scribed in my presence. The recorded information has been reviewed and is accurate.  BP 116/90  Pulse 87  Temp(Src) 98.4 F (36.9 C) (Oral)  Resp 20  SpO2 100%  LMP 07/29/2013   Patty Sermons Camprubi-Soms, PA-C 11/17/13 2130

## 2013-11-17 NOTE — ED Notes (Signed)
Pt here with c/o of nasal congestion, states that she thinks she has a sinus infection.  C/o of facial lesions, states she has been seen by a dermatologist and was told its a reaction to medication. Also states that she wants to be tested for lyme disease.

## 2013-11-17 NOTE — ED Notes (Signed)
Bed: WBH34 Expected date:  Expected time:  Means of arrival:  Comments: Tr3 

## 2013-11-18 ENCOUNTER — Emergency Department (HOSPITAL_COMMUNITY)
Admission: EM | Admit: 2013-11-18 | Discharge: 2013-11-19 | Disposition: A | Discharge status: Home / Self Care | Payer: 59 | Attending: Emergency Medicine | Admitting: Emergency Medicine

## 2013-11-18 ENCOUNTER — Encounter (HOSPITAL_COMMUNITY): Payer: Self-pay | Admitting: Emergency Medicine

## 2013-11-18 DIAGNOSIS — Z862 Personal history of diseases of the blood and blood-forming organs and certain disorders involving the immune mechanism: Secondary | ICD-10-CM | POA: Insufficient documentation

## 2013-11-18 DIAGNOSIS — F22 Delusional disorders: Secondary | ICD-10-CM

## 2013-11-18 DIAGNOSIS — F40298 Other specified phobia: Secondary | ICD-10-CM | POA: Diagnosis not present

## 2013-11-18 DIAGNOSIS — IMO0002 Reserved for concepts with insufficient information to code with codable children: Secondary | ICD-10-CM | POA: Diagnosis not present

## 2013-11-18 DIAGNOSIS — Z91040 Latex allergy status: Secondary | ICD-10-CM | POA: Insufficient documentation

## 2013-11-18 DIAGNOSIS — Z8619 Personal history of other infectious and parasitic diseases: Secondary | ICD-10-CM | POA: Insufficient documentation

## 2013-11-18 DIAGNOSIS — R45851 Suicidal ideations: Secondary | ICD-10-CM | POA: Diagnosis present

## 2013-11-18 DIAGNOSIS — Z8719 Personal history of other diseases of the digestive system: Secondary | ICD-10-CM | POA: Diagnosis not present

## 2013-11-18 DIAGNOSIS — F3289 Other specified depressive episodes: Secondary | ICD-10-CM | POA: Diagnosis not present

## 2013-11-18 DIAGNOSIS — F411 Generalized anxiety disorder: Secondary | ICD-10-CM | POA: Diagnosis not present

## 2013-11-18 DIAGNOSIS — Z87442 Personal history of urinary calculi: Secondary | ICD-10-CM | POA: Insufficient documentation

## 2013-11-18 DIAGNOSIS — Z79899 Other long term (current) drug therapy: Secondary | ICD-10-CM | POA: Diagnosis not present

## 2013-11-18 DIAGNOSIS — F32A Depression, unspecified: Secondary | ICD-10-CM

## 2013-11-18 DIAGNOSIS — F341 Dysthymic disorder: Secondary | ICD-10-CM | POA: Diagnosis not present

## 2013-11-18 DIAGNOSIS — F329 Major depressive disorder, single episode, unspecified: Secondary | ICD-10-CM | POA: Insufficient documentation

## 2013-11-18 DIAGNOSIS — E079 Disorder of thyroid, unspecified: Secondary | ICD-10-CM | POA: Insufficient documentation

## 2013-11-18 DIAGNOSIS — Z872 Personal history of diseases of the skin and subcutaneous tissue: Secondary | ICD-10-CM | POA: Insufficient documentation

## 2013-11-18 LAB — ACETAMINOPHEN LEVEL: Acetaminophen (Tylenol), Serum: <15 ug/mL (ref 10–30)

## 2013-11-18 LAB — RAPID URINE DRUG SCREEN, HOSP PERFORMED
Amphetamines: NOT DETECTED
Barbiturates: NOT DETECTED
Benzodiazepines: POSITIVE — AB
COCAINE: NOT DETECTED
OPIATES: NOT DETECTED
Tetrahydrocannabinol: NOT DETECTED

## 2013-11-18 LAB — COMPREHENSIVE METABOLIC PANEL
ALBUMIN: 4.2 g/dL (ref 3.5–5.2)
ALT: 102 U/L — ABNORMAL HIGH (ref 0–35)
ANION GAP: 16 — AB (ref 5–15)
AST: 28 U/L (ref 0–37)
Alkaline Phosphatase: 150 U/L — ABNORMAL HIGH (ref 39–117)
BUN: 11 mg/dL (ref 6–23)
CO2: 23 mEq/L (ref 19–32)
CREATININE: 0.62 mg/dL (ref 0.50–1.10)
Calcium: 9.4 mg/dL (ref 8.4–10.5)
Chloride: 98 mEq/L (ref 96–112)
GFR calc Af Amer: >90 mL/min (ref >90)
GFR calc non Af Amer: >90 mL/min (ref >90)
Glucose, Bld: 134 mg/dL — ABNORMAL HIGH (ref 70–99)
Potassium: 3.7 mEq/L (ref 3.7–5.3)
Sodium: 137 mEq/L (ref 137–147)
TOTAL PROTEIN: 7.6 g/dL (ref 6.0–8.3)
Total Bilirubin: 0.3 mg/dL (ref 0.3–1.2)

## 2013-11-18 LAB — CBC
HCT: 41.2 % (ref 36.0–46.0)
Hemoglobin: 13.5 g/dL (ref 12.0–15.0)
MCH: 30.3 pg (ref 26.0–34.0)
MCHC: 32.8 g/dL (ref 30.0–36.0)
MCV: 92.4 fL (ref 78.0–100.0)
PLATELETS: 356 10*3/uL (ref 150–400)
RBC: 4.46 MIL/uL (ref 3.87–5.11)
RDW: 14.3 % (ref 11.5–15.5)
WBC: 13.8 10*3/uL — AB (ref 4.0–10.5)

## 2013-11-18 LAB — HEPATITIS PANEL, ACUTE
HCV Ab: NEGATIVE
HEP A IGM: NONREACTIVE
Hep B C IgM: NONREACTIVE
Hepatitis B Surface Ag: NEGATIVE

## 2013-11-18 LAB — ETHANOL

## 2013-11-18 LAB — SALICYLATE LEVEL: Salicylate Lvl: <2 mg/dL — ABNORMAL LOW (ref 2.8–20.0)

## 2013-11-18 MED ORDER — LEVOTHYROXINE SODIUM 50 MCG PO TABS
50.0000 ug | ORAL_TABLET | Freq: Every day | ORAL | Status: DC
  Administered 2013-11-19: 50 ug via ORAL
  Filled 2013-11-18 (×2): qty 1

## 2013-11-18 MED ORDER — DIPHENHYDRAMINE HCL 25 MG PO CAPS
25.0000 mg | ORAL_CAPSULE | Freq: Four times a day (QID) | ORAL | Status: DC | PRN
  Administered 2013-11-19: 25 mg via ORAL
  Filled 2013-11-18: qty 1

## 2013-11-18 MED ORDER — ALBUTEROL SULFATE (2.5 MG/3ML) 0.083% IN NEBU
2.5000 mg | INHALATION_SOLUTION | Freq: Four times a day (QID) | RESPIRATORY_TRACT | Status: DC | PRN
  Filled 2013-11-18: qty 3

## 2013-11-18 MED ORDER — ALPRAZOLAM ER 0.5 MG PO TB24
0.5000 mg | ORAL_TABLET | Freq: Two times a day (BID) | ORAL | Status: DC
  Administered 2013-11-18 – 2013-11-19 (×3): 0.5 mg via ORAL
  Filled 2013-11-18 (×3): qty 1

## 2013-11-18 MED ORDER — OXYCODONE-ACETAMINOPHEN 10-325 MG PO TABS: 1.0000 | ORAL_TABLET | ORAL | Status: DC | PRN

## 2013-11-18 MED ORDER — ONDANSETRON HCL 4 MG PO TABS: 4.0000 mg | ORAL_TABLET | Freq: Three times a day (TID) | ORAL | Status: DC | PRN

## 2013-11-18 MED ORDER — IBUPROFEN 200 MG PO TABS: 600.0000 mg | ORAL_TABLET | Freq: Three times a day (TID) | ORAL | Status: DC | PRN

## 2013-11-18 MED ORDER — FLUOXETINE HCL 20 MG PO CAPS
60.0000 mg | ORAL_CAPSULE | Freq: Every morning | ORAL | Status: DC
  Administered 2013-11-19: 60 mg via ORAL
  Filled 2013-11-18: qty 3

## 2013-11-18 MED ORDER — ZOLPIDEM TARTRATE 5 MG PO TABS
5.0000 mg | ORAL_TABLET | Freq: Every evening | ORAL | Status: DC | PRN
  Administered 2013-11-18: 5 mg via ORAL
  Filled 2013-11-18: qty 1

## 2013-11-18 MED ORDER — FLUOXETINE HCL 20 MG PO CAPS
40.0000 mg | ORAL_CAPSULE | Freq: Every morning | ORAL | Status: DC
  Administered 2013-11-18: 40 mg via ORAL
  Filled 2013-11-18: qty 2

## 2013-11-18 MED ORDER — ALBUTEROL SULFATE HFA 108 (90 BASE) MCG/ACT IN AERS: 2.0000 | INHALATION_SPRAY | Freq: Four times a day (QID) | RESPIRATORY_TRACT | Status: DC | PRN

## 2013-11-18 MED ORDER — ALUM & MAG HYDROXIDE-SIMETH 200-200-20 MG/5ML PO SUSP
30.0000 mL | ORAL | Status: DC | PRN
Start: 2013-11-18 — End: 2013-11-19

## 2013-11-18 MED ORDER — OXYCODONE HCL 5 MG PO TABS
5.0000 mg | ORAL_TABLET | ORAL | Status: DC | PRN
  Administered 2013-11-18 (×2): 5 mg via ORAL
  Filled 2013-11-18 (×2): qty 1

## 2013-11-18 MED ORDER — FLUTICASONE PROPIONATE HFA 110 MCG/ACT IN AERO
2.0000 | INHALATION_SPRAY | Freq: Two times a day (BID) | RESPIRATORY_TRACT | Status: DC
  Administered 2013-11-18: 2 via RESPIRATORY_TRACT
  Filled 2013-11-18: qty 12

## 2013-11-18 MED ORDER — ALPRAZOLAM ER 0.5 MG PO TB24: 0.5000 mg | ORAL_TABLET | Freq: Two times a day (BID) | ORAL | Status: DC

## 2013-11-18 MED ORDER — DAPSONE 5 % EX GEL
1.0000 "application " | Freq: Three times a day (TID) | CUTANEOUS | Status: DC
  Administered 2013-11-18: 1 via CUTANEOUS

## 2013-11-18 MED ORDER — ACETAMINOPHEN 325 MG PO TABS
650.0000 mg | ORAL_TABLET | ORAL | Status: DC | PRN
  Administered 2013-11-18 – 2013-11-19 (×2): 650 mg via ORAL
  Filled 2013-11-18 (×2): qty 2

## 2013-11-18 MED ORDER — OXYCODONE-ACETAMINOPHEN 5-325 MG PO TABS
1.0000 | ORAL_TABLET | ORAL | Status: DC | PRN
  Administered 2013-11-18 (×2): 1 via ORAL
  Filled 2013-11-18 (×2): qty 1

## 2013-11-18 NOTE — ED Notes (Signed)
Phar. Tech in w/pt

## 2013-11-18 NOTE — ED Notes (Signed)
Pt belongings were left with staff at nurses station in psych ed

## 2013-11-18 NOTE — ED Notes (Signed)
Pt putting on scrubs at present time

## 2013-11-18 NOTE — ED Notes (Signed)
Pt ambulatory w/o difficulty to room 

## 2013-11-18 NOTE — ED Provider Notes (Signed)
CSN: [id number]     Arrival date & time 11/18/13  1120 History   First MD Initiated Contact with Patient 11/18/13 1155     Chief Complaint  Patient presents with  . Suicidal     (Consider location/radiation/quality/duration/timing/severity/associated sxs/prior Treatment) Patient is a 43 y.o. female presenting with mental health disorder. The history is provided by the patient. No language interpreter was used.  Mental Health Problem Presenting symptoms: delusional and suicidal thoughts   Presenting symptoms: no agitation   Degree of incapacity (severity):  Severe Duration:  1 week Timing:  Constant Progression:  Worsening Chronicity:  Recurrent Treatment compliance:  Most of the time Time since last psychoactive medication taken:  1 day Relieved by:  Nothing Worsened by:  Nothing tried Ineffective treatments:  None tried Associated symptoms: no abdominal pain, no appetite change, no chest pain, no fatigue and no headaches     Past Medical History  Diagnosis Date  . Skin disorder     lesion on forehead at eyebrow  . Pancreatitis   . Ovarian cyst   . Sinus disease   . Thyroid disease   . Anxiety   . Depression   . Dysmenorrhea   . Fibroid   . Scoliosis   . STD (sexually transmitted disease) [place]  . Urinary incontinence   . Dysthymia   . Gallstones   . Rectal bleeding   . Staph infection   . Sphincter of Oddi dysfunction   . Kidney stones   . Anemia   . Blood disorder 2009    "high Eos count for couple of years"   Past Surgical History  Procedure Laterality Date  . Cholecystectomy    . Back surgery    . Bile duct exploration  2006 2008  . Umbilical hernia repair  2013  . 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 Dilation and curettage of uterus    . Essure tubal ligation  2007  . Robotic assisted total hysterectomy N/A 09/17/2013    Procedure: ROBOTIC ASSISTED TOTAL HYSTERECTOMY ;  Surgeon: Princess Bruins, MD;  Location: [facility];  Service:  Gynecology;  Laterality: N/A;  . Bilateral salpingectomy Bilateral 09/17/2013    Procedure: BILATERAL SALPINGECTOMY;  Surgeon: Princess Bruins, MD;  Location: [facility];  Service: Gynecology;  Laterality: Bilateral;  . Abdominal hysterectomy     Family History  Problem Relation Age of Onset  . Hyperlipidemia Mother   . Hypertension Father   . Breast cancer Paternal Grandmother    History  Substance Use Topics  . Smoking status: Never Smoker   . Smokeless tobacco: Never Used  . Alcohol Use: Yes   OB History   Grav Para Term Preterm Abortions TAB SAB Ect Mult Living   5 1   4 3 1   1      Review of Systems  Constitutional: Negative for fever, chills, diaphoresis, activity change, appetite change and fatigue.  HENT: Negative for congestion, facial swelling, rhinorrhea and sore throat.   Eyes: Negative for photophobia and discharge.  Respiratory: Negative for cough, chest tightness and shortness of breath.   Cardiovascular: Negative for chest pain, palpitations and leg swelling.  Gastrointestinal: Negative for nausea, vomiting, abdominal pain and diarrhea.  Endocrine: Negative for polydipsia and polyuria.  Genitourinary: Negative for dysuria, frequency, difficulty urinating and pelvic pain.  Musculoskeletal: Negative for arthralgias, back pain, neck pain and neck stiffness.  Skin: Positive for wound. Negative for color change.  Allergic/Immunologic: Negative for  immunocompromised state.  Neurological: Negative for facial asymmetry, weakness, numbness and headaches.  Hematological: Does not bruise/bleed easily.  Psychiatric/Behavioral: Positive for suicidal ideas. Negative for confusion and agitation.      Allergies  Latex; Nickel; Keflex; Ciprofloxacin hcl; and Doxycycline  Home Medications   Prior to Admission medications   Medication Sig Start Date End Date Taking? Authorizing Provider  albuterol (PROVENTIL HFA;VENTOLIN HFA) 108 (90 BASE) MCG/ACT inhaler Inhale 2 puffs into  the lungs every 6 (six) hours as needed for wheezing or shortness of breath.   Yes Historical Provider, MD  ALPRAZolam (XANAX XR) 0.5 MG 24 hr tablet Take 0.5 mg by mouth 2 (two) times daily.    Yes Historical Provider, MD  DAPSONE EX Apply 1 application topically 3 (three) times daily.   Yes Historical Provider, MD  diphenhydrAMINE (BENADRYL) 25 mg capsule Take 25 mg by mouth every 6 (six) hours as needed (itching).   Yes Historical Provider, MD  FLUoxetine HCl 60 MG TABS Take 60 mg by mouth daily.   Yes Historical Provider, MD  fluticasone (FLOVENT HFA) 110 MCG/ACT inhaler Inhale 2 puffs into the lungs 2 (two) times daily.   Yes Historical Provider, MD  gabapentin (NEURONTIN) 100 MG capsule Take 100 mg by mouth 3 (three) times daily.   Yes Historical Provider, MD  levothyroxine (SYNTHROID, LEVOTHROID) 50 MCG tablet Take 1 tablet (50 mcg total) by mouth daily before breakfast. 07/30/13  Yes Nena Polio, PA-C  Multiple Vitamin (MULTIVITAMIN WITH MINERALS) TABS tablet Take 1 tablet by mouth daily.   Yes Historical Provider, MD  OxyCODONE HCl ER (OXYCONTIN) 30 MG T12A Take 30 mg by mouth 3 (three) times daily.   Yes Historical Provider, MD  predniSONE (STERAPRED UNI-PAK) 10 MG tablet Take by mouth See admin instructions. Taper dose 11/12/13  Yes Historical Provider, MD  zolpidem (AMBIEN CR) 12.5 MG CR tablet Take 1 tablet (12.5 mg total) by mouth at bedtime. 07/30/13  Yes Neil Mashburn, PA-C   BP 120/78  Pulse 91  Temp(Src) 98 F (36.7 C) (Oral)  Resp 18  SpO2 100%  LMP 07/29/2013 Physical Exam  Constitutional: She is oriented to person, place, and time. She appears well-developed and well-nourished. No distress.  HENT:  Head: Normocephalic and atraumatic.    Mouth/Throat: No oropharyngeal exudate.  3 small healing shallow ulcerations on face as marked. No surrounding infection.   Eyes: Pupils are equal, round, and reactive to light.  Neck: Normal range of motion. Neck supple.    Cardiovascular: Normal rate, regular rhythm and normal heart sounds.  Exam reveals no gallop and no friction rub.   No murmur heard. Pulmonary/Chest: Effort normal and breath sounds normal. No respiratory distress. She has no wheezes. She has no rales.  Abdominal: Soft. Bowel sounds are normal. She exhibits no distension and no mass. There is no tenderness. There is no rebound and no guarding.  Musculoskeletal: Normal range of motion. She exhibits no edema and no tenderness.  Neurological: She is alert and oriented to person, place, and time.  Skin: Skin is warm and dry.     Psychiatric: She has a normal mood and affect.    ED Course  Procedures (including critical care time) Labs Review Labs Reviewed  CBC - Abnormal; Notable for the following:    WBC 13.8 (*)    All other components within normal limits  COMPREHENSIVE METABOLIC PANEL - Abnormal; Notable for the following:    Glucose, Bld 134 (*)    ALT 102 (*)  Alkaline Phosphatase 150 (*)    Anion gap 16 (*)    All other components within normal limits  SALICYLATE LEVEL - Abnormal; Notable for the following:    Salicylate Lvl <7.8 (*)    All other components within normal limits  URINE RAPID DRUG SCREEN (HOSP PERFORMED) - Abnormal; Notable for the following:    Benzodiazepines POSITIVE (*)    All other components within normal limits  ACETAMINOPHEN LEVEL  ETHANOL    Imaging Review No results found.   EKG Interpretation None      MDM   Final diagnoses:  Depression  Suicidal ideation  Delusions of parasitosis    Pt is a 43 y.o. female with Pmhx as above who presents with SI. She states she was seen here yesterday, was placed in [facility] for SI, but told staff she was no longer suicidal last night because she was nervous about being admitted. She was discharged. She reports continued SI this morning, thoughts of OD and feeling that her family would be better off w/o her. He reports she would be willing to stay for inpt  tx if accepted. She is here voluntarily. She admits to having much distress over her chronic skin lesions and reports having a preoccupation with a concern that she has an undiagnosed fungal or parasicic skin infection that she will inadvertently pass to her husband or son. Denies HI. She has seen multiple dermatologist's over the years including [facility] in '14 who suspected [place]'s.  NO skin lesions appear acutely infected.         Neta Ehlers, MD 11/18/13 2041

## 2013-11-18 NOTE — ED Notes (Signed)
tts into see 

## 2013-11-18 NOTE — ED Notes (Signed)
Pt is aware that  the EDP is not going to re-order the antibiotic,

## 2013-11-18 NOTE — ED Notes (Signed)
Up to the bathroom 

## 2013-11-18 NOTE — ED Notes (Signed)
Pt now c/o HA

## 2013-11-18 NOTE — ED Notes (Signed)
She is escorted to our psych. E.D. Without incident at this time after she is wanded and dressed in wine scrubs.

## 2013-11-18 NOTE — ED Notes (Signed)
Pt has black pocketbook with brown handle black and silver cell phone tan tennis shoes blue jean short underwear and bra

## 2013-11-18 NOTE — BH Assessment (Signed)
Tele Assessment Note   Mariah Reyes is a 43 y.o. female who presents to [facility] with SI/Depression.  Initially presented on 11/17/13 with SI/depression and was d/c by extender to follow up with outpatient psych/therapist.  Pls see note below: 43 y.o. female who presents to [facility] ED due to having skin lesions and a sinus infection. Pt also reported that at times reporting suicidal ideations. Pt denies SI at this time but stated "I don't know how I will feel in the next two days or next week". Pt reported that she has attempted suicide in the past by taking sleeping medications. Pt has been hospitalized several times due to suicidal gestures and ideations. Pt is currently receiving mental health and medication management from [place]. Pt reported that her facial lesions are a stressor for her and stated "I don't want to keep getting the sores". Pt reported that she recently had a biopsy completed by her dermatologist who concluded that the lesions were results of bug bites or an allergic reaction to medication. Pt stated "I'm afraid if I have to go off of my medication I will be suicidal". Pt reported that "I feel like a leopard, I feel trapped". Pt is endorsing depressive symptoms and stated "I feel frustrated and sad". Pt reported that she sleeps for 5-6 hours at night and has a poor appetite. Pt denies HI and AVH at this time. Pt reported that she owns a handgun. Pt reported that earlier this week she was unsure if she was hallucination or not when she noticed "white fibers" coming out of her stool and white fibers coming out of tick that she pulled from her hair approximately one week ago. Pt did not report any criminal charges or upcoming court dates. Pt did not report any illicit substance use and reported that she drinks alcohol socially. Pt reported that she has a strong support system which includes her mother, husband and two sisters. 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 Pt did not report any physical, sexual or emotional abuse at this time.   Pt continues to endorse SI and states she has no specific plan, however says that if she was going to harm herself it would be by overdose.  Pt says she is SI due to skin lesions that she has for 2 yrs and and has not been able to cure them.  Pt is under the care of a dermatologist and is taking numerous medications with no success.  Pt is not able to contract for safety.  Pt denies AVH/SA/HI.   Axis I: Major Depression, Recurrent severe Axis II: Deferred Axis III:  Past Medical History  Diagnosis Date  . Skin disorder     lesion on forehead at eyebrow  . Pancreatitis   . Ovarian cyst   . Sinus disease   . Thyroid disease   . Anxiety   . Depression   . Dysmenorrhea   . Fibroid   . Scoliosis   . STD (sexually transmitted disease) [place]  . Urinary incontinence   . Dysthymia   . Gallstones   . Rectal bleeding   . Staph infection   . Sphincter of Oddi dysfunction   . Kidney stones   . Anemia   . Blood disorder 2009    "high Eos count for couple of years"   Axis IV: other psychosocial or environmental problems, problems related to  social environment and problems with access to health care services Axis V: 31-40 impairment in reality testing  Past Medical History:  Past Medical History  Diagnosis Date  . Skin disorder     lesion on forehead at eyebrow  . Pancreatitis   . Ovarian cyst   . Sinus disease   . Thyroid disease   . Anxiety   . Depression   . Dysmenorrhea   . Fibroid   . Scoliosis   . STD (sexually transmitted disease) [place]  . Urinary incontinence   . Dysthymia   . Gallstones   . Rectal bleeding   . Staph infection   . Sphincter of Oddi dysfunction   . Kidney stones   . Anemia   . Blood disorder 2009    "high Eos count for couple of years"    Past Surgical History  Procedure Laterality Date  . Cholecystectomy    .  Back surgery    . Bile duct exploration  2006 2008  . Umbilical hernia repair  2013  . 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 Dilation and curettage of uterus    . Essure tubal ligation  2007  . Robotic assisted total hysterectomy N/A 09/17/2013    Procedure: ROBOTIC ASSISTED TOTAL HYSTERECTOMY ;  Surgeon: Princess Bruins, MD;  Location: [facility];  Service: Gynecology;  Laterality: N/A;  . Bilateral salpingectomy Bilateral 09/17/2013    Procedure: BILATERAL SALPINGECTOMY;  Surgeon: Princess Bruins, MD;  Location: [facility];  Service: Gynecology;  Laterality: Bilateral;  . Abdominal hysterectomy      Family History:  Family History  Problem Relation Age of Onset  . Hyperlipidemia Mother   . Hypertension Father   . Breast cancer Paternal Grandmother     Social History:  reports that she has never smoked. She has never used smokeless tobacco. She reports that she drinks alcohol. She reports that she does not use illicit drugs.  Additional Social History:  Alcohol / Drug Use Pain Medications: See MAR  Prescriptions: See MAR  Over the Counter: See MAR  History of alcohol / drug use?: No history of alcohol / drug abuse Longest period of sobriety (when/how long): None   CIWA: CIWA-Ar BP: 115/75 mmHg Pulse Rate: 78 COWS:    PATIENT STRENGTHS: (choose at least two) Capable of independent living Supportive family/friends  Allergies:  Allergies  Allergen Reactions  . Latex Itching and Rash    Reactivated asthma  . Nickel Itching, Swelling and Rash  . Keflex [Cephalexin]     Able to take with Benadryl  . Ciprofloxacin Hcl Hives and Rash  . Doxycycline Rash    Home Medications:  (Not in a hospital admission)  OB/GYN Status:  Patient's last menstrual period was 07/29/2013.  General Assessment Data Location of Assessment: [facility] ED Is this a Tele or Face-to-Face Assessment?: Face-to-Face Is this an Initial Assessment or a Re-assessment for this encounter?: Initial  Assessment Living Arrangements: Children;Spouse/significant other (Lives with spouse and son ) Can pt return to current living arrangement?: Yes Admission Status: Voluntary Is patient capable of signing voluntary admission?: Yes Transfer from: [facility] Referral Source: MD  Medical Screening Exam ([place]) Medical Exam completed: No Reason for MSE not completed: Other: (None )  [place] Living Arrangements: Children;Spouse/significant other (Lives with spouse and son ) Name of Psychiatrist: Noemi Chapel  Name of Therapist: Talmage Nap  Education Status Is patient currently in school?: No Current  Grade: None  Highest grade of school patient has completed: 12 Name of school: None  Contact person: None   Risk to self with the past 6 months Suicidal Ideation: Yes-Currently Present Suicidal Intent: No-Not Currently/Within Last 6 Months Is patient at risk for suicide?: No Suicidal Plan?: No-Not Currently/Within Last 6 Months Access to Means: Yes Specify Access to Suicidal Means: Pills/Medications  What has been your use of drugs/alcohol within the last 12 months?: Pt denies  Previous Attempts/Gestures: Yes How many times?: 1 Other Self Harm Risks: None  Triggers for Past Attempts: None known Intentional Self Injurious Behavior: None Family Suicide History: No Recent stressful life event(s): Other (Comment) (Health problems--skin lesions x39yrs ) Persecutory voices/beliefs?: No Depression: Yes Depression Symptoms: Tearfulness;Isolating;Fatigue;Loss of interest in usual pleasures;Feeling worthless/self pity Substance abuse history and/or treatment for substance abuse?: No Suicide prevention information given to non-admitted patients: Not applicable  Risk to Others within the past 6 months Homicidal Ideation: No Thoughts of Harm to Others: No Current Homicidal Intent: No Current Homicidal Plan: No Access to Homicidal Means: No Identified Victim:  None  History of harm to others?: No Assessment of Violence: None Noted Violent Behavior Description: None  Does patient have access to weapons?: No Criminal Charges Pending?: No Does patient have a court date: No  Psychosis Hallucinations: None noted Delusions: None noted  Mental Status Report Appear/Hygiene: In scrubs Eye Contact: Fair Motor Activity: Unremarkable Speech: Logical/coherent Level of Consciousness: Alert;Quiet/awake Mood: Depressed;Sad Affect: Depressed;Sad Anxiety Level: None Thought Processes: Coherent;Relevant Judgement: Impaired Orientation: Person;Place;Time;Situation Obsessive Compulsive Thoughts/Behaviors: None  Cognitive Functioning Concentration: Fair Memory: Recent Intact;Remote Intact IQ: Average Insight: Poor Impulse Control: Fair Appetite: Poor Weight Loss: 0 Weight Gain: 0 Sleep: Decreased Total Hours of Sleep: 5 Vegetative Symptoms: Staying in bed;Not bathing;Decreased grooming  ADLScreening [facility] Assessment Services) Patient's cognitive ability adequate to safely complete daily activities?: Yes Patient able to express need for assistance with ADLs?: Yes Independently performs ADLs?: Yes (appropriate for developmental age)  Prior Inpatient Therapy Prior Inpatient Therapy: Yes Prior Therapy Dates: 2012; 2015 Prior Therapy Facilty/Provider(s): [place] [facility] Reason for Treatment: Suicidal/Depression   Prior Outpatient Therapy Prior Outpatient Therapy: Yes Prior Therapy Dates: Current  Prior Therapy Facilty/Provider(s): [facility]  Reason for Treatment: Med Mgt/ Therapy   ADL Screening (condition at time of admission) Patient's cognitive ability adequate to safely complete daily activities?: Yes Is the patient deaf or have difficulty hearing?: No Does the patient have difficulty seeing, even when wearing glasses/contacts?: No Does the patient have difficulty concentrating,  remembering, or making decisions?: No Patient able to express need for assistance with ADLs?: Yes Does the patient have difficulty dressing or bathing?: No Independently performs ADLs?: Yes (appropriate for developmental age) Does the patient have difficulty walking or climbing stairs?: No Weakness of Legs: None Weakness of Arms/Hands: None  Home Assistive Devices/Equipment Home Assistive Devices/Equipment: None  Therapy Consults (therapy consults require a physician order) PT Evaluation Needed: No OT Evalulation Needed: No SLP Evaluation Needed: No Abuse/Neglect Assessment (Assessment to be complete while patient is alone) Physical Abuse: Denies Verbal Abuse: Denies Sexual Abuse: Denies Exploitation of patient/patient's resources: Denies Self-Neglect: Denies Values / Beliefs Cultural Requests During Hospitalization: None Spiritual Requests During Hospitalization: None Consults Spiritual Care Consult Needed: No Social Work Consult Needed: No Regulatory affairs officer (For Healthcare) Advance Directive: Patient does not have advance directive;Patient would not like information Pre-existing out of facility DNR order (yellow form or pink MOST form): No Nutrition Screen- [facility]  Adult/[facility] Patient's home diet: Regular  Additional Information 1:1 In Past 12 Months?: No CIRT Risk: No Elopement Risk: No Does patient have medical clearance?: Yes     Disposition:  Disposition Initial Assessment Completed for this Encounter: Yes Disposition of Patient: Referred to (Observation overnight and re-evaluate in the AM) Type of outpatient treatment: Adult Patient referred to: Other (Comment) (Observation overnight and re-evaluate in the AM )  Girtha Rm 11/18/2013 4:41 PM

## 2013-11-18 NOTE — ED Notes (Signed)
She tearfully tells me that she is so upset about a recurrent skin rash (especially at face) that "it won't let me go anywhere or do anything--I just look so bad--I'm beginning to feel like I'd be better off if I just die".  She is hyperactive.  She further tells me she spent the night "at a local hotel, so I haven't been home to take my medicines-and I take a lot of them!"

## 2013-11-19 DIAGNOSIS — F411 Generalized anxiety disorder: Secondary | ICD-10-CM

## 2013-11-19 DIAGNOSIS — R45851 Suicidal ideations: Secondary | ICD-10-CM

## 2013-11-19 DIAGNOSIS — F3289 Other specified depressive episodes: Secondary | ICD-10-CM

## 2013-11-19 DIAGNOSIS — F329 Major depressive disorder, single episode, unspecified: Secondary | ICD-10-CM

## 2013-11-19 MED ORDER — FLUOXETINE HCL 60 MG PO TABS: 60.0000 mg | ORAL_TABLET | Freq: Every day | ORAL | Status: AC

## 2013-11-19 MED ORDER — DAPSONE 5 % EX GEL: 1.0000 "application " | Freq: Two times a day (BID) | CUTANEOUS | Status: AC

## 2013-11-19 MED ORDER — LEVOTHYROXINE SODIUM 50 MCG PO TABS: 50.0000 ug | ORAL_TABLET | Freq: Every day | ORAL | Status: AC

## 2013-11-19 MED ORDER — ZOLPIDEM TARTRATE ER 12.5 MG PO TBCR: 12.5000 mg | EXTENDED_RELEASE_TABLET | Freq: Every day | ORAL | Status: AC

## 2013-11-19 MED ORDER — FLUTICASONE PROPIONATE HFA 110 MCG/ACT IN AERO: 2.0000 | INHALATION_SPRAY | Freq: Two times a day (BID) | RESPIRATORY_TRACT | Status: AC

## 2013-11-19 MED ORDER — GABAPENTIN 100 MG PO CAPS: 100.0000 mg | ORAL_CAPSULE | Freq: Three times a day (TID) | ORAL | Status: AC

## 2013-11-19 NOTE — ED Notes (Signed)
Pt came up to nursing station upset, facial grimace, stating "can I have some Benadryl for my stuffy nose?", explained to pt that indication for her Benadryl order is for itching, pt became even more upset stating "It takes like 10 Benadryl to make me sleep", "Benadryl is better than nothing", MD notified--plan is for pt to be discharged home with out pt resources.  Pt also worried about husband finding out that she is taking opiates, states "he doesn't know I take any meds, if he finds out he will divorce me", pt states that she goes to a pain clinic and that her prescription ran out 10 days early because "my pain flared up so I took more than i was prescribed, if yall aren't going to give me any pain meds then I might as well be dead", "my tolerance has gone up", MD and NP notified.

## 2013-11-19 NOTE — ED Notes (Signed)
Awaiting TTS to give patient referral information

## 2013-11-19 NOTE — Progress Notes (Addendum)
  CARE MANAGEMENT ED NOTE 5/0/2774  Patient:  Mariah Reyes, Mariah Reyes   Account Number:  [id number]  Date Initiated:  11/19/2013  Documentation initiated by:  Jackelyn Poling  Subjective/Objective Assessment:   43 yr old [employer] pt Came to [facility] ED on 11/18/13 upset about a recurrent skin rash (especially at face) that "it won't let me go anywhere or do anything--I just look so bad--I'm beginning to feel like I'd be better off if I just die"     Subjective/Objective Assessment Detail:   Dx Major Depression, Recurrent severe  UDS positive only for Benzodiazepines  Pt confirmed pcp as Amy boucherle in [place]  09/17/13 Pelvic pain, irregular menses, S/P Essure (Nickle allergy) surgery: Robotic total laparoscopic hysterectomy with bilateral salpingectomies Pt d/c home with her mother Pt also listed with husband  Pt noted to be shivering when CM spoke with her Pt confirmed she has been having lots of pain for "fifteen years"  States her pain management provider is "[facility] in [place]" This is listed as a PA in EPIC     Action/Plan:   ED Cm reviewed EPIC notes, Pt reviewed in [facility] ED progression meeting, ED CM spoke with pt about providers and pain management   Action/Plan Detail:   Email sent to [facility] ED medical director Zenia Resides) for care plan Discussed ED care plan with pt Provided list of [facility] pain management [place] in network providers   Anticipated DC Date:  11/19/2013     Status Recommendation to Physician:   Result of Recommendation:    Other ED Services  Consult Working [place]  Other  Outpatient Services - Pt will follow up  PCP issues    Choice offered to / List presented to:            Status of service:  Completed, signed off  ED Comments:   ED Comments Detail:    Pt inquired about suboxone treatment Referred to TTS staff

## 2013-11-19 NOTE — ED Notes (Signed)
Pt to drive self home after discharge.

## 2013-11-19 NOTE — BH Assessment (Addendum)
Dr. Dwyane Dee is recommending that patient be scheduled for outpatient suboxone services with Dr.Kaur. Spoke with Star Age at [employer] office  who reports that patient has to follow-up herself and is required to pay a $350 dollar deposit towards co-pay. Patient was provided with SA referrals to include information about Dr.Kaur and Pain management resources. Crisis resources provided prior to d/c from ED.   Shaune Pollack, MS, [place] Assessment Counselor

## 2013-11-19 NOTE — Consult Note (Signed)
[facility] ED Face-to-face Psychiatry Consult  Subjective: Pt seen and chart reviewed. Pt denies HI and AVH, reports intermittent suicidal ideation. Pt reports that "if she doesn't get her oxycodone, she will be suicidal". Pt cites that stressors include chronic pain, lack of medications, and facial lesions. Pt refuses options to be referred to a different pain management provider, therapist, or other type of psychiatric provider. Pt was informed by Dr. Dwyane Dee and this NP that [facility] treatment areas do not specialize in pain management. Pt's UDS was negative for opiates which is inconsistent with pt reporting that she takes Oxycodone daily as part of a pain management treatment program. Pt reports that she cannot remember the name of the clinic although the person's name who prescribes it is "[facility]". Pt declines permission to allow [place] to talk to her husband and reports that he knows nothing about any of this and that she has to stay in a hotel so that he doesn't find out or "he will divorce and block me from seeing my son". Pt asked staff members about being prescribed Adderall as well as Xanax and Oxycodone and Oxycontin. Pt did test positive for benzos but no other substances that would support current pharmacotherapy treatment as patient stated. Pt asked about being treated with Suboxone and wants a referral and is able to contract for safety. Pt will be referred to Dr. Toy Care in [place].     HPI:  Mariah Reyes is a 43 y.o. female who presents to [facility] with SI/Depression. Initially presented on 11/17/13 with SI/depression and was d/c by extender to follow up with outpatient psych/therapist. Pls see note below: 43 y.o. female who presents to [facility] ED due to having skin lesions and a sinus infection. Pt also reported that at times reporting suicidal  ideations. Pt denies SI at this time but stated "I don't know how I will feel in the next two days or next week". Pt reported that she has attempted suicide in the past by taking sleeping medications. Pt has been hospitalized several times due to suicidal gestures and ideations. Pt is currently receiving mental health and medication management from [place]. Pt reported that her facial lesions are a stressor for her and stated "I don't want to keep getting the sores". Pt reported that she recently had a biopsy completed by her dermatologist who concluded that the lesions were results of bug bites or an allergic reaction to medication. Pt stated "I'm afraid if I have to go off of my medication I will be suicidal". Pt reported that "I feel like a leopard, I feel trapped". Pt is endorsing depressive symptoms and stated "I feel frustrated and sad". Pt reported that she sleeps for 5-6 hours at night and has a poor appetite. Pt denies HI and AVH at this time. Pt reported that she owns a handgun. Pt reported that earlier this week she was unsure if she was hallucination or not when she noticed "white fibers" coming out of her stool and white fibers coming out of tick that she pulled from her hair approximately one week ago. Pt did not report any criminal charges or upcoming court dates. Pt did not report any illicit substance use and reported that she drinks alcohol socially. Pt reported that she has a strong support system which includes her mother, husband and two sisters. 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 Pt did not report any physical, sexual  or emotional abuse at this time.   Psychiatric Specialty Exam: Physical Exam  ROS  Blood pressure 110/77, pulse 20, temperature 97.4 F (36.3 C), temperature source Oral, resp. rate 20, last menstrual period 07/29/2013, SpO2 100.00%.There is no weight on file to calculate BMI.  General  Appearance: Fairly Groomed  Engineer, water::  Fair  Speech:  Slow  Volume:  Normal  Mood:  Anxious  Affect:  Tearful  Thought Process:  Circumstantial  Orientation:  Full (Time, Place, and Person)  Thought Content:  Rumination about medication management and narcotics  Suicidal Thoughts:  States "if she doesn't get oxycodone, she will be suicidal"  Homicidal Thoughts:  No  Memory:  Immediate;   Fair Recent;   Fair Remote;   Fair  Judgement:  Fair  Insight:  Fair  Psychomotor Activity:  Normal  Concentration:  Good  Recall:  Fair  Akathisia:  No  Handed:    AIMS (if indicated):     Assets:  Desire for Improvement Resilience  Sleep:       Plan:  -Discharge home with referral to Dr. Toy Care for possible Suboxone treatment per pt request.  Benjamine Mola, FNP-BC    12:32PM     11/19/2013

## 2013-11-19 NOTE — ED Notes (Signed)
Per NP push fluids and recheck BP and pulse in 1 hour, hold discharge at this time.

## 2013-11-19 NOTE — ED Notes (Signed)
Awaiting TTS to give referral information to patient.

## 2013-11-19 NOTE — ED Notes (Signed)
Awaiting TTS to give patient referral information.

## 2013-11-19 NOTE — ED Notes (Signed)
Per TTS, pt has received referral information and is ready for discharge

## 2013-11-19 NOTE — ED Notes (Signed)
Notified NP pt BP 91/39 p-53 prior to discharge

## 2013-11-19 NOTE — ED Notes (Signed)
MD notified of pt's vitals recheck--see VS flowsheet, discharged pt.

## 2013-11-20 NOTE — Consult Note (Signed)
Patient seen, evaluated by me, treatment plan from limited by me. Patient continues to report that she feels suicidal because of the pain but has no intentions of hurting herself. She has that she was to go to an outpatient Suboxone clinic. She states that she does not want anyone to contact her husband as he would do worse if he finds that she is addicted to narcotics. Patient states that her physician will no longer prescribe oxycodone, refuses to give name of her physician or the pain clinic that she goes to. Patient does contract for safety and states that she's okay with being referred to a Suboxone clinic.

## 2013-12-13 ENCOUNTER — Other Ambulatory Visit: Payer: Self-pay | Admitting: Orthopaedic Surgery

## 2013-12-13 DIAGNOSIS — M545 Low back pain, unspecified: Secondary | ICD-10-CM

## 2014-01-02 ENCOUNTER — Inpatient Hospital Stay: Admission: RE | Admit: 2014-01-02 | Payer: Self-pay | Source: Ambulatory Visit

## 2014-01-03 ENCOUNTER — Ambulatory Visit
Admission: RE | Admit: 2014-01-03 | Discharge: 2014-01-03 | Disposition: A | Discharge status: Home / Self Care | Payer: 59 | Source: Ambulatory Visit | Attending: Orthopaedic Surgery | Admitting: Orthopaedic Surgery

## 2014-01-03 DIAGNOSIS — M545 Low back pain, unspecified: Secondary | ICD-10-CM

## 2014-02-18 ENCOUNTER — Encounter (HOSPITAL_COMMUNITY): Payer: Self-pay | Admitting: Emergency Medicine

## 2015-05-02 ENCOUNTER — Encounter (HOSPITAL_COMMUNITY): Payer: Self-pay | Admitting: Emergency Medicine

## 2015-05-02 ENCOUNTER — Emergency Department (HOSPITAL_COMMUNITY): Payer: Managed Care, Other (non HMO)

## 2015-05-02 ENCOUNTER — Emergency Department (HOSPITAL_COMMUNITY)
Admission: EM | Admit: 2015-05-02 | Discharge: 2015-05-02 | Disposition: A | Discharge status: Home / Self Care | Payer: Managed Care, Other (non HMO) | Attending: Emergency Medicine | Admitting: Emergency Medicine

## 2015-05-02 DIAGNOSIS — Z872 Personal history of diseases of the skin and subcutaneous tissue: Secondary | ICD-10-CM | POA: Insufficient documentation

## 2015-05-02 DIAGNOSIS — Z9049 Acquired absence of other specified parts of digestive tract: Secondary | ICD-10-CM | POA: Diagnosis not present

## 2015-05-02 DIAGNOSIS — Z7951 Long term (current) use of inhaled steroids: Secondary | ICD-10-CM | POA: Diagnosis not present

## 2015-05-02 DIAGNOSIS — M549 Dorsalgia, unspecified: Secondary | ICD-10-CM | POA: Insufficient documentation

## 2015-05-02 DIAGNOSIS — R1013 Epigastric pain: Secondary | ICD-10-CM | POA: Insufficient documentation

## 2015-05-02 DIAGNOSIS — R1032 Left lower quadrant pain: Secondary | ICD-10-CM | POA: Insufficient documentation

## 2015-05-02 DIAGNOSIS — Z8742 Personal history of other diseases of the female genital tract: Secondary | ICD-10-CM | POA: Diagnosis not present

## 2015-05-02 DIAGNOSIS — Z862 Personal history of diseases of the blood and blood-forming organs and certain disorders involving the immune mechanism: Secondary | ICD-10-CM | POA: Diagnosis not present

## 2015-05-02 DIAGNOSIS — R110 Nausea: Secondary | ICD-10-CM | POA: Diagnosis not present

## 2015-05-02 DIAGNOSIS — Z90710 Acquired absence of both cervix and uterus: Secondary | ICD-10-CM | POA: Diagnosis not present

## 2015-05-02 DIAGNOSIS — Z8709 Personal history of other diseases of the respiratory system: Secondary | ICD-10-CM | POA: Diagnosis not present

## 2015-05-02 DIAGNOSIS — Z86018 Personal history of other benign neoplasm: Secondary | ICD-10-CM | POA: Insufficient documentation

## 2015-05-02 DIAGNOSIS — Z9851 Tubal ligation status: Secondary | ICD-10-CM | POA: Insufficient documentation

## 2015-05-02 DIAGNOSIS — F419 Anxiety disorder, unspecified: Secondary | ICD-10-CM | POA: Diagnosis not present

## 2015-05-02 DIAGNOSIS — R21 Rash and other nonspecific skin eruption: Secondary | ICD-10-CM | POA: Insufficient documentation

## 2015-05-02 DIAGNOSIS — Z8619 Personal history of other infectious and parasitic diseases: Secondary | ICD-10-CM | POA: Diagnosis not present

## 2015-05-02 DIAGNOSIS — K5900 Constipation, unspecified: Secondary | ICD-10-CM | POA: Insufficient documentation

## 2015-05-02 DIAGNOSIS — Z87442 Personal history of urinary calculi: Secondary | ICD-10-CM | POA: Insufficient documentation

## 2015-05-02 DIAGNOSIS — R1031 Right lower quadrant pain: Secondary | ICD-10-CM | POA: Insufficient documentation

## 2015-05-02 DIAGNOSIS — Z91040 Latex allergy status: Secondary | ICD-10-CM | POA: Diagnosis not present

## 2015-05-02 DIAGNOSIS — E079 Disorder of thyroid, unspecified: Secondary | ICD-10-CM | POA: Insufficient documentation

## 2015-05-02 DIAGNOSIS — Z79899 Other long term (current) drug therapy: Secondary | ICD-10-CM | POA: Insufficient documentation

## 2015-05-02 DIAGNOSIS — F329 Major depressive disorder, single episode, unspecified: Secondary | ICD-10-CM | POA: Diagnosis not present

## 2015-05-02 DIAGNOSIS — R1030 Lower abdominal pain, unspecified: Secondary | ICD-10-CM | POA: Diagnosis present

## 2015-05-02 LAB — URINALYSIS, ROUTINE W REFLEX MICROSCOPIC
Bilirubin Urine: NEGATIVE
Glucose, UA: NEGATIVE mg/dL
Hgb urine dipstick: NEGATIVE
KETONES UR: NEGATIVE mg/dL
Leukocytes, UA: NEGATIVE
NITRITE: NEGATIVE
PROTEIN: NEGATIVE mg/dL
Specific Gravity, Urine: 1.014 (ref 1.005–1.030)
pH: 5.5 (ref 5.0–8.0)

## 2015-05-02 LAB — COMPREHENSIVE METABOLIC PANEL
ALT: 20 U/L (ref 14–54)
AST: 29 U/L (ref 15–41)
Albumin: 4.4 g/dL (ref 3.5–5.0)
Alkaline Phosphatase: 88 U/L (ref 38–126)
Anion gap: 13 (ref 5–15)
BILIRUBIN TOTAL: 0.5 mg/dL (ref 0.3–1.2)
BUN: 10 mg/dL (ref 6–20)
CO2: 23 mmol/L (ref 22–32)
Calcium: 9.4 mg/dL (ref 8.9–10.3)
Chloride: 107 mmol/L (ref 101–111)
Creatinine, Ser: 0.67 mg/dL (ref 0.44–1.00)
Glucose, Bld: 91 mg/dL (ref 65–99)
POTASSIUM: 3.5 mmol/L (ref 3.5–5.1)
Sodium: 143 mmol/L (ref 135–145)
TOTAL PROTEIN: 7.4 g/dL (ref 6.5–8.1)

## 2015-05-02 LAB — CBC
HEMATOCRIT: 41.5 % (ref 36.0–46.0)
Hemoglobin: 13.6 g/dL (ref 12.0–15.0)
MCH: 30.4 pg (ref 26.0–34.0)
MCHC: 32.8 g/dL (ref 30.0–36.0)
MCV: 92.8 fL (ref 78.0–100.0)
Platelets: 408 10*3/uL — ABNORMAL HIGH (ref 150–400)
RBC: 4.47 MIL/uL (ref 3.87–5.11)
RDW: 14.1 % (ref 11.5–15.5)
WBC: 17.2 10*3/uL — AB (ref 4.0–10.5)

## 2015-05-02 LAB — DIFFERENTIAL
BASOS PCT: 2 %
Basophils Absolute: 0.3 10*3/uL — ABNORMAL HIGH (ref 0.0–0.1)
EOS ABS: 0.5 10*3/uL (ref 0.0–0.7)
Eosinophils Relative: 3 %
Lymphocytes Relative: 20 %
Lymphs Abs: 3.4 10*3/uL (ref 0.7–4.0)
MONO ABS: 1 10*3/uL (ref 0.1–1.0)
Monocytes Relative: 6 %
Neutro Abs: 12 10*3/uL — ABNORMAL HIGH (ref 1.7–7.7)
Neutrophils Relative %: 69 %

## 2015-05-02 LAB — LIPASE, BLOOD: Lipase: 27 U/L (ref 11–51)

## 2015-05-02 NOTE — ED Notes (Signed)
2 unsuccessful attempts drawing labs -  Pt stated they have had to use [place] to start IVs in the past

## 2015-05-02 NOTE — ED Notes (Signed)
Unable to obtain lab draw. Pt states she was at urgent care and they could not get blood from right a/c or right hand. I attempted left a/c. Pt states doctor has had to use ultrasound for IV access.

## 2015-05-02 NOTE — ED Provider Notes (Signed)
CSN: [id number]     Arrival date & time 05/02/15  1209 History   First MD Initiated Contact with Patient 05/02/15 1456     Chief Complaint  Patient presents with  . Abdominal Pain      Patient is a 45 y.o. female presenting with abdominal pain. The history is provided by the patient.  Abdominal Pain Associated symptoms: constipation and nausea   Associated symptoms: no fatigue and no shortness of breath    patient presents with abdominal pain. She's had for the last almost 2 months. States she seen a primary care doctor had an elevated white count. She states he did not know where any infection was was started her on Levaquin. The pain is dull in the lower abdomen. It is constant. She has had some mild constipation. No fevers. Decreased appetite. States she also follow up with GYN today who did a pelvic exam and ultrasound which was negative. She also states she had some blood in the urine. She states she also saw her about a podiatrist who gave her more Levaquin for an infection on her toe. States she also had a right lower leg lesion that took a while to heal. She states she was tested for diabetes. She states she does not have diabetes. Previous pancreatitis. States she had blood work around 2 weeks ago that showed a slightly elevated white count. States otherwise the lab work is reassuring and enzymes for abdomen were negative. States she's also been taking Motrin for the pains and has not been taking her antiacid as much because it. He tore of other drugs. States she has a reaction to contrast dye were she needs to get Benadryl.  Past Medical History  Diagnosis Date  . Skin disorder     lesion on forehead at eyebrow  . Pancreatitis   . Ovarian cyst   . Sinus disease   . Thyroid disease   . Anxiety   . Depression   . Dysmenorrhea   . Fibroid   . Scoliosis   . STD (sexually transmitted disease) [place]  . Urinary incontinence   . Dysthymia   . Gallstones   . Rectal bleeding    . Staph infection   . Sphincter of Oddi dysfunction   . Kidney stones   . Anemia   . Blood disorder 2009    "high Eos count for couple of years"   Past Surgical History  Procedure Laterality Date  . Cholecystectomy    . Back surgery    . Bile duct exploration  2006 2008  . Umbilical hernia repair  2013  . 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 Dilation and curettage of uterus    . Essure tubal ligation  2007  . Robotic assisted total hysterectomy N/A 09/17/2013    Procedure: ROBOTIC ASSISTED TOTAL HYSTERECTOMY ;  Surgeon: Princess Bruins, MD;  Location: [facility];  Service: Gynecology;  Laterality: N/A;  . Bilateral salpingectomy Bilateral 09/17/2013    Procedure: BILATERAL SALPINGECTOMY;  Surgeon: Princess Bruins, MD;  Location: [facility];  Service: Gynecology;  Laterality: Bilateral;  . Abdominal hysterectomy     Family History  Problem Relation Age of Onset  . Hyperlipidemia Mother   . Hypertension Father   . Breast cancer Paternal Grandmother    Social History  Substance Use Topics  . Smoking status: Never Smoker   . Smokeless tobacco: Never Used  . Alcohol Use: Yes   OB History  Gravida Para Term Preterm AB TAB SAB Ectopic Multiple Living   5 1   4 3 1   1      Review of Systems  Constitutional: Positive for appetite change. Negative for fatigue.  Respiratory: Negative for shortness of breath.   Gastrointestinal: Positive for nausea, abdominal pain and constipation.  Musculoskeletal: Positive for back pain.  Skin: Positive for rash.  Neurological: Negative for numbness.      Allergies  Keflex; Latex; Nickel; Sulfa antibiotics; Other; Ciprofloxacin hcl; and Doxycycline  Home Medications   Prior to Admission medications   Medication Sig Start Date End Date Taking? Authorizing Provider  acetaminophen (TYLENOL) 500 MG tablet Take 1,000 mg by mouth 2 (two) times daily as needed for mild pain.    Yes Historical Provider, MD  albuterol (PROVENTIL HFA;VENTOLIN  HFA) 108 (90 BASE) MCG/ACT inhaler Inhale 2 puffs into the lungs every 6 (six) hours as needed for wheezing or shortness of breath.   Yes Historical Provider, MD  ALPRAZolam (XANAX XR) 0.5 MG 24 hr tablet Take 0.5 mg by mouth 2 (two) times daily.   Yes Historical Provider, MD  amphetamine-dextroamphetamine (ADDERALL XR) 20 MG 24 hr capsule Take 20 mg by mouth daily. 09/26/14  Yes Historical Provider, MD  Dapsone 5 % topical gel Apply 1 application topically 2 (two) times daily. 11/19/13  Yes Benjamine Mola, FNP  diphenhydrAMINE (BENADRYL) 25 mg capsule Take 25 mg by mouth every 6 (six) hours as needed (itching).   Yes Historical Provider, MD  EPINEPHrine (EPIPEN 2-PAK) 0.3 mg/0.3 mL IJ SOAJ injection Inject 0.3 mg into the muscle once.   Yes Historical Provider, MD  FLUoxetine HCl 60 MG TABS Take 60 mg by mouth daily. 11/19/13  Yes Benjamine Mola, FNP  fluticasone (FLOVENT HFA) 110 MCG/ACT inhaler Inhale 2 puffs into the lungs 2 (two) times daily. 11/19/13  Yes Benjamine Mola, FNP  levothyroxine (SYNTHROID, LEVOTHROID) 50 MCG tablet Take 1 tablet (50 mcg total) by mouth daily before breakfast. 11/19/13  Yes Benjamine Mola, FNP  Meth-Hyo-M Barnett Hatter Phos-Ph Sal (URIBEL PO) Take 1 tablet by mouth 4 (four) times daily as needed (bladder pain).   Yes Historical Provider, MD  naproxen sodium (ANAPROX) 220 MG tablet Take 220 mg by mouth 2 (two) times daily as needed (pain).   Yes Historical Provider, MD  oxyCODONE 30 MG 12 hr tablet Take 30 mg by mouth 3 (three) times daily. 06/03/15 07/03/15 Yes Historical Provider, MD  Probiotic Product (PROBIOTIC PO) Take 1 capsule by mouth daily.   Yes Historical Provider, MD  pseudoephedrine (SUDAFED) 120 MG 12 hr tablet Take 120 mg by mouth daily as needed for congestion.   Yes Historical Provider, MD  tiZANidine (ZANAFLEX) 4 MG tablet Take 4 mg by mouth 3 (three) times daily as needed for muscle spasms.  04/26/15  Yes Historical Provider, MD  zolpidem (AMBIEN CR) 12.5 MG CR tablet  Take 1 tablet (12.5 mg total) by mouth at bedtime. 11/19/13  Yes Benjamine Mola, FNP  gabapentin (NEURONTIN) 100 MG capsule Take 1 capsule (100 mg total) by mouth 3 (three) times daily. 11/19/13   Elyse Jarvis Withrow, FNP   BP 126/102 mmHg  Pulse 70  Temp(Src) 98.3 F (36.8 C) (Oral)  Resp 20  SpO2 99%  LMP 07/29/2013 Physical Exam  Constitutional: She appears well-developed.  HENT:  Head: Atraumatic.  Eyes: EOM are normal.  Neck: Neck supple.  Cardiovascular: Normal rate.   Pulmonary/Chest: Effort normal.  Abdominal: Soft.  There is tenderness.  Mild left lower quadrant and epigastric tenderness without rebound or guarding.  Musculoskeletal: Normal range of motion.  Slight erythema to medial aspect of left great toe.  Neurological: She is alert.  Skin: Skin is warm.  Psychiatric:  Patient has a somewhat odd affect    ED Course  Procedures (including critical care time) Labs Review Labs Reviewed  CBC - Abnormal; Notable for the following:    WBC 17.2 (*)    Platelets 408 (*)    All other components within normal limits  URINALYSIS, ROUTINE W REFLEX MICROSCOPIC (NOT AT [facility]) - Abnormal; Notable for the following:    Color, Urine GREEN (*)    APPearance CLOUDY (*)    All other components within normal limits  DIFFERENTIAL - Abnormal; Notable for the following:    Neutro Abs 12.0 (*)    Basophils Absolute 0.3 (*)    All other components within normal limits  LIPASE, BLOOD  COMPREHENSIVE METABOLIC PANEL    Imaging Review Ct Abdomen Pelvis Wo Contrast  05/02/2015  CLINICAL DATA:  Right lower quadrant abdominal pain for 3 days. EXAM: CT ABDOMEN AND PELVIS WITHOUT CONTRAST TECHNIQUE: Multidetector CT imaging of the abdomen and pelvis was performed following the standard protocol without IV contrast. COMPARISON:  CT scan 06/30/2013 FINDINGS: Lower chest: The lung bases are clear. The heart is normal in size. No pericardial effusion. The distal esophagus is grossly normal.  Hepatobiliary: No focal hepatic lesions or intrahepatic biliary dilatation. The gallbladder is surgically absent. No significant common bile duct dilatation. Pancreas: No mass, inflammation or ductal dilatation. Spleen: Normal size.  No focal lesions. Adrenals/Urinary Tract: The adrenal glands are normal. No renal, ureteral or bladder calculi or mass. Stomach/Bowel: The stomach, duodenum, small bowel and colon are grossly normal without oral contrast. No inflammatory changes, mass lesions or obstructive findings. The terminal ileum is normal. Vascular/Lymphatic: No mesenteric or retroperitoneal mass or adenopathy. The aorta is normal in caliber. No atherosclerotic calcifications. Other: The uterus is surgically absent. The left ovary is still present. I do not see the right ovary for certain. No pelvic mass or adenopathy. No free pelvic fluid collections. No inguinal mass or adenopathy. Musculoskeletal: No significant bony findings. Lumbar fusion hardware noted at L4-5. Moderate scoliosis. IMPRESSION: No acute abdominal/ pelvic findings, mass lesions or adenopathy. No findings to explain the patient's lower abdominal pain. No renal, ureteral or bladder calculi. Electronically Signed   By: Marijo Sanes M.D.   On: 05/02/2015 15:55   I have personally reviewed and evaluated these images and lab results as part of my medical decision-making.   EKG Interpretation None      MDM   Final diagnoses:  Right lower quadrant abdominal pain    Patient with lower abdominal pain. Has had for the last few months. CT scan done reassuring. White count is elevated but no clear cause. Has been on antibiotics. Seen by GYN today. Will discharge home.    Davonna Belling, MD 05/03/15 [id number]

## 2015-05-02 NOTE — Discharge Instructions (Signed)

## 2015-05-02 NOTE — ED Notes (Signed)
Per pt, states right lower abdominal pain for 3 days-states she has been seen at [facility] and at GYN this am

## 2015-05-08 ENCOUNTER — Other Ambulatory Visit: Payer: Self-pay | Admitting: Family

## 2015-05-27 ENCOUNTER — Ambulatory Visit: Payer: Self-pay | Admitting: Family Medicine

## 2015-06-05 ENCOUNTER — Emergency Department (HOSPITAL_COMMUNITY)
Admission: EM | Admit: 2015-06-05 | Discharge: 2015-06-05 | Disposition: A | Discharge status: Home / Self Care | Payer: Managed Care, Other (non HMO) | Attending: Emergency Medicine | Admitting: Emergency Medicine

## 2015-06-05 ENCOUNTER — Encounter (HOSPITAL_COMMUNITY): Payer: Self-pay | Admitting: Vascular Surgery

## 2015-06-05 ENCOUNTER — Emergency Department (HOSPITAL_COMMUNITY): Payer: Managed Care, Other (non HMO)

## 2015-06-05 DIAGNOSIS — Z8614 Personal history of Methicillin resistant Staphylococcus aureus infection: Secondary | ICD-10-CM | POA: Diagnosis not present

## 2015-06-05 DIAGNOSIS — Z86018 Personal history of other benign neoplasm: Secondary | ICD-10-CM | POA: Diagnosis not present

## 2015-06-05 DIAGNOSIS — Z9889 Other specified postprocedural states: Secondary | ICD-10-CM | POA: Insufficient documentation

## 2015-06-05 DIAGNOSIS — Z3202 Encounter for pregnancy test, result negative: Secondary | ICD-10-CM | POA: Diagnosis not present

## 2015-06-05 DIAGNOSIS — Z872 Personal history of diseases of the skin and subcutaneous tissue: Secondary | ICD-10-CM | POA: Insufficient documentation

## 2015-06-05 DIAGNOSIS — Z8619 Personal history of other infectious and parasitic diseases: Secondary | ICD-10-CM | POA: Insufficient documentation

## 2015-06-05 DIAGNOSIS — Z8739 Personal history of other diseases of the musculoskeletal system and connective tissue: Secondary | ICD-10-CM | POA: Insufficient documentation

## 2015-06-05 DIAGNOSIS — Z87442 Personal history of urinary calculi: Secondary | ICD-10-CM | POA: Insufficient documentation

## 2015-06-05 DIAGNOSIS — R1030 Lower abdominal pain, unspecified: Secondary | ICD-10-CM

## 2015-06-05 DIAGNOSIS — Z862 Personal history of diseases of the blood and blood-forming organs and certain disorders involving the immune mechanism: Secondary | ICD-10-CM | POA: Diagnosis not present

## 2015-06-05 DIAGNOSIS — K5909 Other constipation: Secondary | ICD-10-CM | POA: Insufficient documentation

## 2015-06-05 DIAGNOSIS — Z90710 Acquired absence of both cervix and uterus: Secondary | ICD-10-CM | POA: Diagnosis not present

## 2015-06-05 DIAGNOSIS — Z9049 Acquired absence of other specified parts of digestive tract: Secondary | ICD-10-CM | POA: Insufficient documentation

## 2015-06-05 DIAGNOSIS — Z8742 Personal history of other diseases of the female genital tract: Secondary | ICD-10-CM | POA: Diagnosis not present

## 2015-06-05 DIAGNOSIS — F419 Anxiety disorder, unspecified: Secondary | ICD-10-CM | POA: Insufficient documentation

## 2015-06-05 DIAGNOSIS — F329 Major depressive disorder, single episode, unspecified: Secondary | ICD-10-CM | POA: Diagnosis not present

## 2015-06-05 DIAGNOSIS — Z79899 Other long term (current) drug therapy: Secondary | ICD-10-CM | POA: Insufficient documentation

## 2015-06-05 DIAGNOSIS — Z7951 Long term (current) use of inhaled steroids: Secondary | ICD-10-CM | POA: Insufficient documentation

## 2015-06-05 DIAGNOSIS — Z9851 Tubal ligation status: Secondary | ICD-10-CM | POA: Insufficient documentation

## 2015-06-05 DIAGNOSIS — K625 Hemorrhage of anus and rectum: Secondary | ICD-10-CM | POA: Diagnosis present

## 2015-06-05 DIAGNOSIS — E079 Disorder of thyroid, unspecified: Secondary | ICD-10-CM | POA: Insufficient documentation

## 2015-06-05 LAB — COMPREHENSIVE METABOLIC PANEL
ALT: 22 U/L (ref 14–54)
AST: 31 U/L (ref 15–41)
Albumin: 4.2 g/dL (ref 3.5–5.0)
Alkaline Phosphatase: 114 U/L (ref 38–126)
Anion gap: 12 (ref 5–15)
BUN: 7 mg/dL (ref 6–20)
CO2: 23 mmol/L (ref 22–32)
Calcium: 9.6 mg/dL (ref 8.9–10.3)
Chloride: 108 mmol/L (ref 101–111)
Creatinine, Ser: 0.81 mg/dL (ref 0.44–1.00)
GFR calc Af Amer: >60 mL/min (ref >60)
GFR calc non Af Amer: >60 mL/min (ref >60)
Glucose, Bld: 99 mg/dL (ref 65–99)
Potassium: 4 mmol/L (ref 3.5–5.1)
Sodium: 143 mmol/L (ref 135–145)
Total Bilirubin: 0.4 mg/dL (ref 0.3–1.2)
Total Protein: 7.4 g/dL (ref 6.5–8.1)

## 2015-06-05 LAB — CBC
HCT: 42.1 % (ref 36.0–46.0)
Hemoglobin: 13.7 g/dL (ref 12.0–15.0)
MCH: 30 pg (ref 26.0–34.0)
MCHC: 32.5 g/dL (ref 30.0–36.0)
MCV: 92.3 fL (ref 78.0–100.0)
Platelets: 409 10*3/uL — ABNORMAL HIGH (ref 150–400)
RBC: 4.56 MIL/uL (ref 3.87–5.11)
RDW: 14.3 % (ref 11.5–15.5)
WBC: 11.3 10*3/uL — ABNORMAL HIGH (ref 4.0–10.5)

## 2015-06-05 LAB — TYPE AND SCREEN
ABO/RH(D): AB POS
Antibody Screen: NEGATIVE

## 2015-06-05 LAB — ABO/RH: ABO/RH(D): AB POS

## 2015-06-05 LAB — I-STAT BETA HCG BLOOD, ED (MC, WL, AP ONLY): I-stat hCG, quantitative: <5 m[IU]/mL (ref <5)

## 2015-06-05 LAB — LIPASE, BLOOD: Lipase: 27 U/L (ref 11–51)

## 2015-06-05 MED ORDER — LORAZEPAM 2 MG/ML IJ SOLN
1.0000 mg | Freq: Once | INTRAMUSCULAR | Status: AC
  Administered 2015-06-05: 1 mg via INTRAVENOUS
  Filled 2015-06-05: qty 1

## 2015-06-05 MED ORDER — MORPHINE SULFATE (PF) 4 MG/ML IV SOLN
4.0000 mg | Freq: Once | INTRAVENOUS | Status: AC
  Administered 2015-06-05: 4 mg via INTRAVENOUS
  Filled 2015-06-05: qty 1

## 2015-06-05 MED ORDER — FLEET ENEMA 7-19 GM/118ML RE ENEM
1.0000 | ENEMA | Freq: Once | RECTAL | Status: AC
  Administered 2015-06-05: 1 via RECTAL
  Filled 2015-06-05: qty 1

## 2015-06-05 MED ORDER — SODIUM CHLORIDE 0.9 % IV BOLUS (SEPSIS)
1000.0000 mL | Freq: Once | INTRAVENOUS | Status: AC
  Administered 2015-06-05: 1000 mL via INTRAVENOUS

## 2015-06-05 NOTE — ED Provider Notes (Signed)
CSN: [id number]     Arrival date & time 06/05/15  1310 History   First MD Initiated Contact with Patient 06/05/15 1517     Chief Complaint  Patient presents with  . Abdominal Pain  . Rectal Bleeding     (Consider location/radiation/quality/duration/timing/severity/associated sxs/prior Treatment) The history is provided by the patient.  Mariah Reyes is a 45 y.o. female hx of depression, fibroids, rectal bleeding, unknown leukocytosis here with abdominal, rectal bleeding, constipation. Patient states that for the last several days she has been constipated. She has tried Marketing executive with minimal relief. This morning she has some blood in her stool as well as some mucus in her stool. She went to see Dr. Collene Mares from GI, who examined her and now her pain is worse. She also has a pain doctor that prescribed Keppra and oxycodone for her pain and she tried Keppra for the first on several days ago and thought that it may have contributed to her pain. She was seen in January for similar symptoms and had a normal CT pelvis at that time. He saw heme/onc at [facility] several days ago for evaluation for leukocytosis.       Past Medical History  Diagnosis Date  . Skin disorder     lesion on forehead at eyebrow  . Pancreatitis   . Ovarian cyst   . Sinus disease   . Thyroid disease   . Anxiety   . Depression   . Dysmenorrhea   . Fibroid   . Scoliosis   . STD (sexually transmitted disease) [place]  . Urinary incontinence   . Dysthymia   . Gallstones   . Rectal bleeding   . Staph infection   . Sphincter of Oddi dysfunction   . Kidney stones   . Anemia   . Blood disorder 2009    "high Eos count for couple of years"   Past Surgical History  Procedure Laterality Date  . Cholecystectomy    . Back surgery    . Bile duct exploration  2006 2008  . Umbilical hernia repair  2013  . 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 Dilation and curettage of uterus    . Essure tubal ligation   2007  . Robotic assisted total hysterectomy N/A 09/17/2013    Procedure: ROBOTIC ASSISTED TOTAL HYSTERECTOMY ;  Surgeon: Princess Bruins, MD;  Location: [facility];  Service: Gynecology;  Laterality: N/A;  . Bilateral salpingectomy Bilateral 09/17/2013    Procedure: BILATERAL SALPINGECTOMY;  Surgeon: Princess Bruins, MD;  Location: [facility];  Service: Gynecology;  Laterality: Bilateral;  . Abdominal hysterectomy     Family History  Problem Relation Age of Onset  . Hyperlipidemia Mother   . Hypertension Father   . Breast cancer Paternal Grandmother    Social History  Substance Use Topics  . Smoking status: Never Smoker   . Smokeless tobacco: Never Used  . Alcohol Use: Yes   OB History    Gravida Para Term Preterm AB TAB SAB Ectopic Multiple Living   5 1   4 3 1   1      Review of Systems  Gastrointestinal: Positive for abdominal pain, blood in stool, hematochezia and rectal pain.  All other systems reviewed and are negative.     Allergies  Keflex; Latex; Nickel; Sulfa antibiotics; Other; Ciprofloxacin hcl; and Doxycycline  Home Medications   Prior to Admission medications   Medication Sig Start Date End Date Taking? Authorizing  Provider  acetaminophen (TYLENOL) 500 MG tablet Take 1,000 mg by mouth 2 (two) times daily as needed for mild pain.    Yes Historical Provider, MD  albuterol (PROVENTIL HFA;VENTOLIN HFA) 108 (90 BASE) MCG/ACT inhaler Inhale 2 puffs into the lungs every 6 (six) hours as needed for wheezing or shortness of breath.   Yes Historical Provider, MD  ALPRAZolam (XANAX XR) 0.5 MG 24 hr tablet Take 0.5 mg by mouth 2 (two) times daily.   Yes Historical Provider, MD  amphetamine-dextroamphetamine (ADDERALL XR) 20 MG 24 hr capsule Take 20 mg by mouth daily. 09/26/14  Yes Historical Provider, MD  Dapsone 5 % topical gel Apply 1 application topically 2 (two) times daily. 11/19/13  Yes Benjamine Mola, FNP  diphenhydrAMINE (BENADRYL) 25 mg capsule Take 25 mg by mouth every 6  (six) hours as needed (itching).   Yes Historical Provider, MD  EPINEPHrine (EPIPEN 2-PAK) 0.3 mg/0.3 mL IJ SOAJ injection Inject 0.3 mg into the muscle once.   Yes Historical Provider, MD  FLUoxetine HCl 60 MG TABS Take 60 mg by mouth daily. 11/19/13  Yes Benjamine Mola, FNP  fluticasone (FLOVENT HFA) 110 MCG/ACT inhaler Inhale 2 puffs into the lungs 2 (two) times daily. 11/19/13  Yes Benjamine Mola, FNP  levETIRAcetam (KEPPRA) 250 MG tablet Take 250 mg by mouth at bedtime.   Yes Historical Provider, MD  levothyroxine (SYNTHROID, LEVOTHROID) 50 MCG tablet Take 1 tablet (50 mcg total) by mouth daily before breakfast. 11/19/13  Yes Benjamine Mola, FNP  Meth-Hyo-M Barnett Hatter Phos-Ph Sal (URIBEL PO) Take 1 tablet by mouth 4 (four) times daily as needed (bladder pain).   Yes Historical Provider, MD  naproxen sodium (ANAPROX) 220 MG tablet Take 220 mg by mouth 2 (two) times daily as needed (pain).   Yes Historical Provider, MD  oxyCODONE 30 MG 12 hr tablet Take 30 mg by mouth 3 (three) times daily. 06/03/15 07/03/15 Yes Historical Provider, MD  Probiotic Product (PROBIOTIC PO) Take 1 capsule by mouth daily.   Yes Historical Provider, MD  pseudoephedrine (SUDAFED) 120 MG 12 hr tablet Take 120 mg by mouth daily as needed for congestion.   Yes Historical Provider, MD  tiZANidine (ZANAFLEX) 4 MG tablet Take 4 mg by mouth 3 (three) times daily as needed for muscle spasms.  04/26/15  Yes Historical Provider, MD  zolpidem (AMBIEN CR) 12.5 MG CR tablet Take 1 tablet (12.5 mg total) by mouth at bedtime. 11/19/13  Yes Benjamine Mola, FNP  gabapentin (NEURONTIN) 100 MG capsule Take 1 capsule (100 mg total) by mouth 3 (three) times daily. Patient not taking: Reported on 06/05/2015 11/19/13   Benjamine Mola, FNP   BP 116/69 mmHg  Pulse 79  Temp(Src) 98.4 F (36.9 C) (Oral)  Resp 16  SpO2 99%  LMP 07/29/2013 Physical Exam  Constitutional: She is oriented to person, place, and time.  Anxious   HENT:  Head: Normocephalic.   Mouth/Throat: Oropharynx is clear and moist.  Eyes: Conjunctivae are normal. Pupils are equal, round, and reactive to light.  Neck: Normal range of motion. Neck supple.  Cardiovascular: Normal rate, regular rhythm and normal heart sounds.   Pulmonary/Chest: Effort normal and breath sounds normal. No respiratory distress. She has no wheezes. She has no rales.  Abdominal: Soft. Bowel sounds are normal.  Mild diffuse tenderness, worse in lower aspects. No rebound   Musculoskeletal: Normal range of motion. She exhibits no edema or tenderness.  Neurological: She is alert and oriented  to person, place, and time. No cranial nerve deficit. Coordination normal.  Skin: Skin is warm and dry.  Psychiatric: She has a normal mood and affect. Her behavior is normal. Judgment and thought content normal.  Nursing note and vitals reviewed.   ED Course  Procedures (including critical care time) Labs Review Labs Reviewed  CBC - Abnormal; Notable for the following:    WBC 11.3 (*)    Platelets 409 (*)    All other components within normal limits  COMPREHENSIVE METABOLIC PANEL  LIPASE, BLOOD  URINALYSIS, ROUTINE W REFLEX MICROSCOPIC (NOT AT [facility])  I-STAT BETA HCG BLOOD, ED ([facility], [facility], [facility] ONLY)  POC OCCULT BLOOD, ED  TYPE AND SCREEN  ABO/RH    Imaging Review Dg Abd Acute W/chest  06/05/2015  CLINICAL DATA:  Constipation and periumbilical pain EXAM: DG ABDOMEN ACUTE W/ 1V CHEST COMPARISON:  Abdominal CT 05/02/2015 FINDINGS: There is no evidence of dilated bowel loops or free intraperitoneal air. No radiopaque calculi or other significant radiographic abnormality is seen. Heart size and mediastinal contours are within normal limits. Both lungs are clear. S shaped thoracolumbar scoliosis. L4-5 PLIF. Cholecystectomy clips. IMPRESSION: Negative abdominal radiographs. No stool retention to correlate with constipation history. No acute cardiopulmonary disease. Electronically Signed   By: Monte Fantasia M.D.   On:  06/05/2015 17:35   I have personally reviewed and evaluated these images and lab results as part of my medical decision-making.   EKG Interpretation None      MDM   Final diagnoses:  None   Mariah Reyes is a 45 y.o. female here with abdominal pain, rectal bleeding. She already had rectal exam in the office that showed blood but no obvious hemorrhoids. Mild diffuse lower abdominal tenderness but no rebound. Has hx of chronic pain. I wonder if this is acute on chronic pain vs diverticulosis. Will discuss case with Dr. Collene Mares.   3:50 pm Called Dr. Collene Mares, who states that she had extensive GI workup recently including CT ab/pel and colonoscopy which are not revealing. Thinks likely constipation and was not concerned for any abdominal emergencies. Will get xrays. Will give enema.   7:27 PM WBC 11, which is improved from previous. Xray showed some constipation. Given enema and had some hard stools come out. Will increase miralax to twice daily. Will have her see Dr. Collene Mares outpatient.     Wandra Arthurs, MD 06/05/15 Kathyrn Drown

## 2015-06-05 NOTE — ED Notes (Signed)
Pt reports to the ED for eval of abd pain and rectal bleeding x several days. She reports she f/ued with GI today and they pushed on her stomach and did a rectal exam and it made her pain worse. States she recently started taking Keppra and she's not sure if this is from this or her MD thought it could be an abdominal infection. Pt reports bright red bleeding and yellow mucous colored stools. Pt A&OX4, resp e/u, and skin warm and dry.

## 2015-06-05 NOTE — Discharge Instructions (Signed)
Increase miralax to twice daily .  Stay hydrated.   See Dr. Collene Mares outpatient and your pain specialist.   Return to ER if you have severe abdominal pain, vomiting, fevers, worse rectal bleeding.

## 2015-06-07 IMAGING — CT CT ABD-PELV W/ CM
3 of 6 series · 15 of 46 positions shown, 17 images · IV contrast (APPLIED)
Comparison: US SONOHYSTEROGRAM dated 05/10/2013; CT ABD/PELVIS W CM
dated 04/04/2013

CLINICAL DATA: Rectal bleeding, abdominal pain, nausea, parasites
in stool per patient.

EXAM:
CT ABDOMEN AND PELVIS WITH CONTRAST
TECHNIQUE: Multidetector CT imaging of the abdomen and pelvis was performed
using the standard protocol following bolus administration of
intravenous contrast.
CONTRAST:  100mL OMNIPAQUE IOHEXOL 300 MG/ML  SOLN

[Series 2: abd/ pelvis 5.0 i30f 1 · axial · 0.71mm/px · z∈[-700,-315]mm · 11 of 93 slices shown, 13 images]
[im 8/93  soft-tissue]
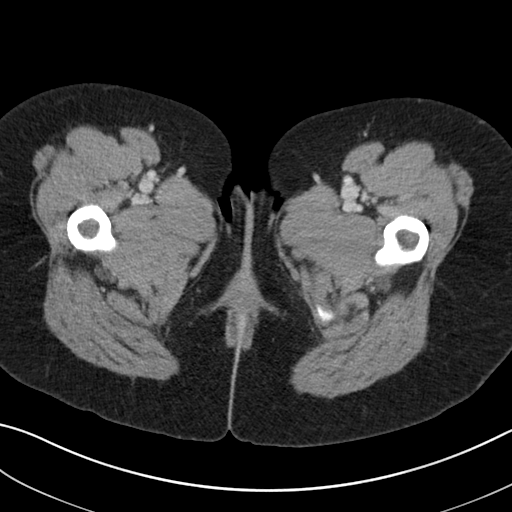
[im 8/93  bone]
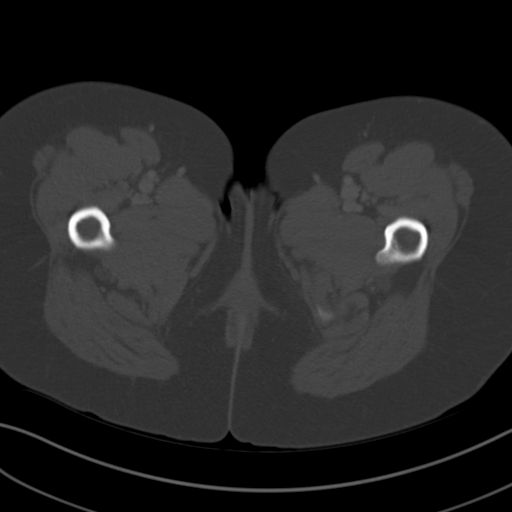
[im 16/93  soft-tissue]
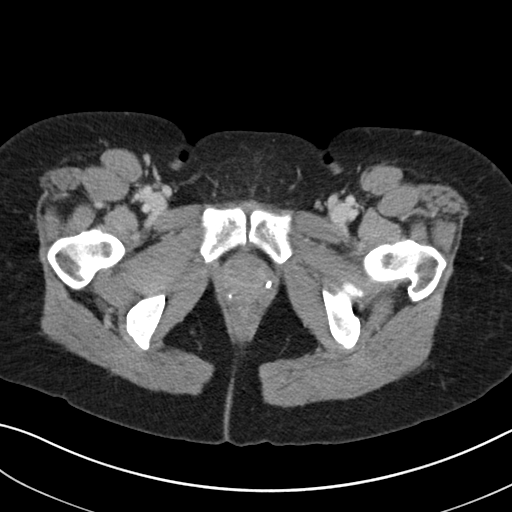
[im 24/93  soft-tissue]
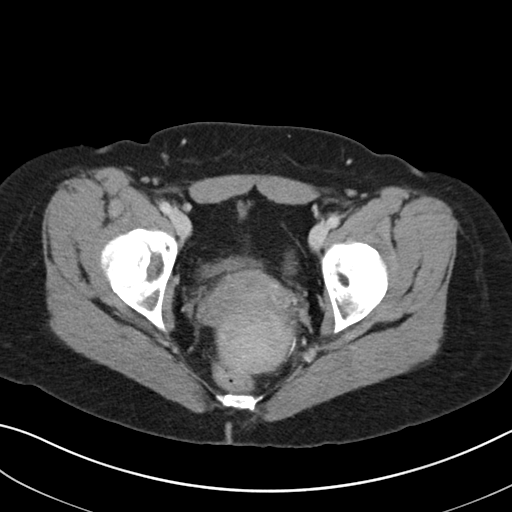
[im 31/93  soft-tissue]
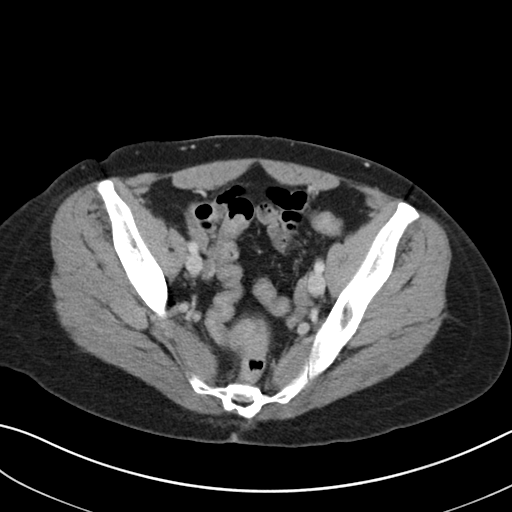
[im 39/93  soft-tissue]
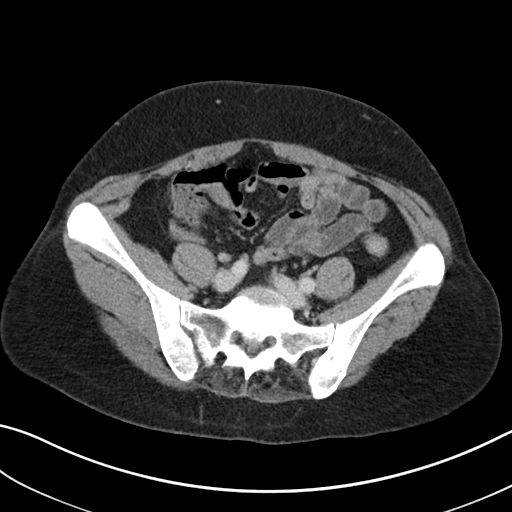
[im 47/93  soft-tissue]
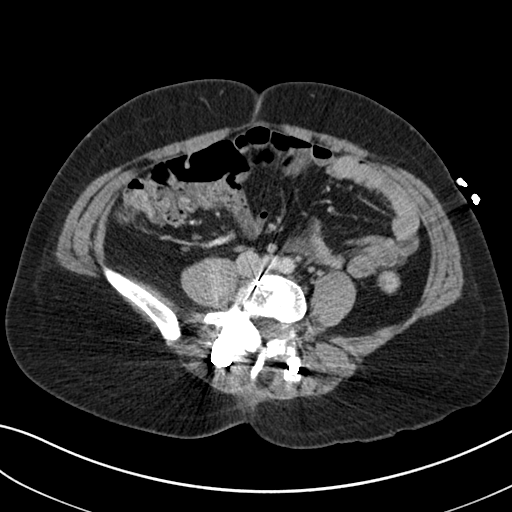
[im 54/93  soft-tissue]
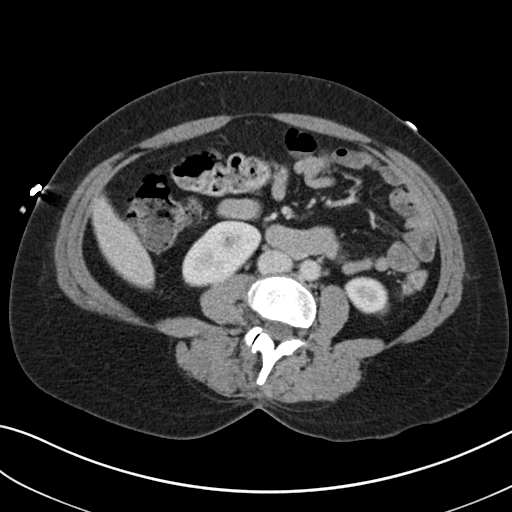
[im 62/93  soft-tissue]
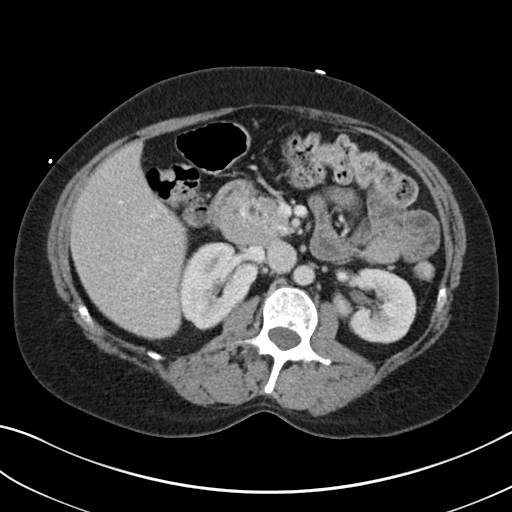
[im 70/93  soft-tissue]
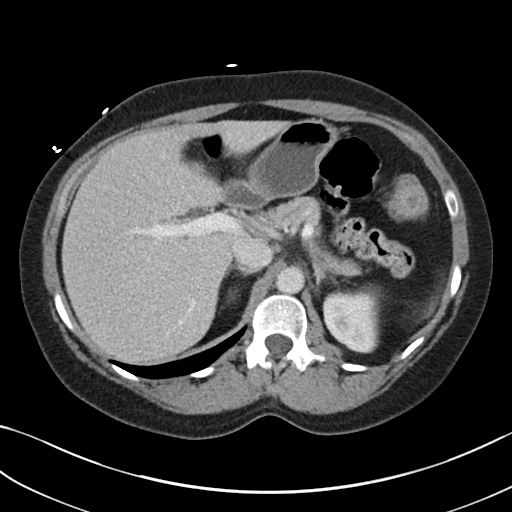
[im 70/93  bone]
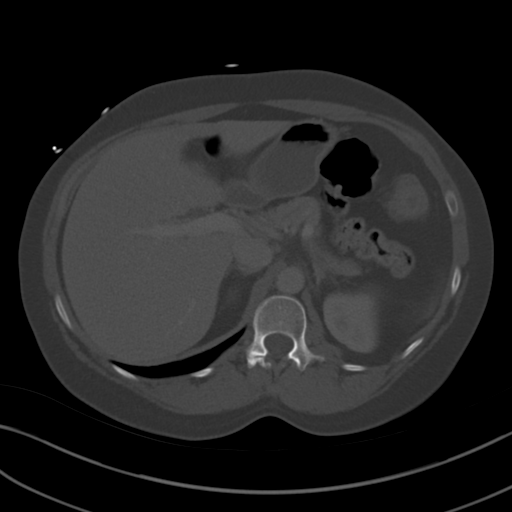
[im 77/93  soft-tissue]
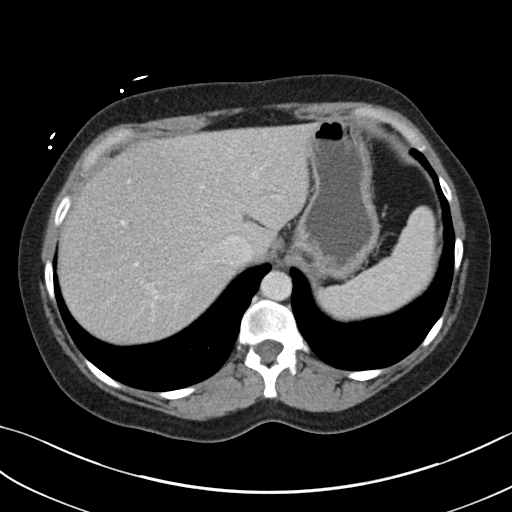
[im 85/93  soft-tissue]
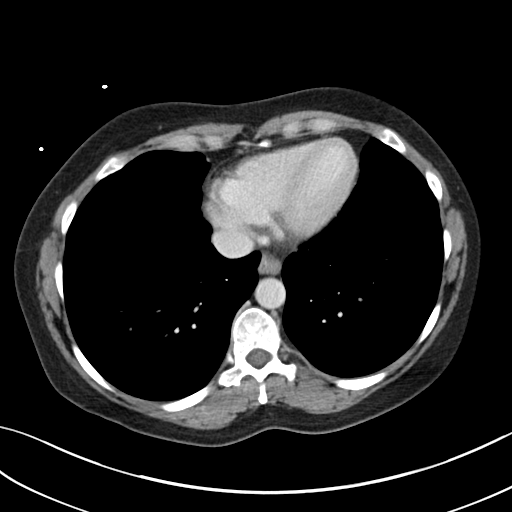

[Series 7: lung · axial · 0.71mm/px · 1 of 26 slices shown]
[im 9/26  bone]
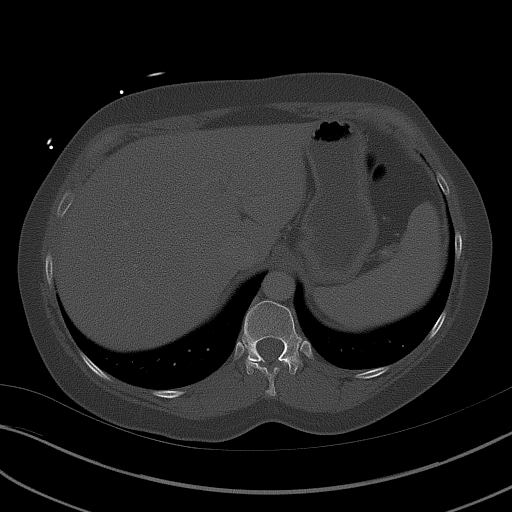

[Series 8: coronals · coronal · 0.63mm/px · 3 of 119 slices shown]
[im 40/119  soft-tissue]
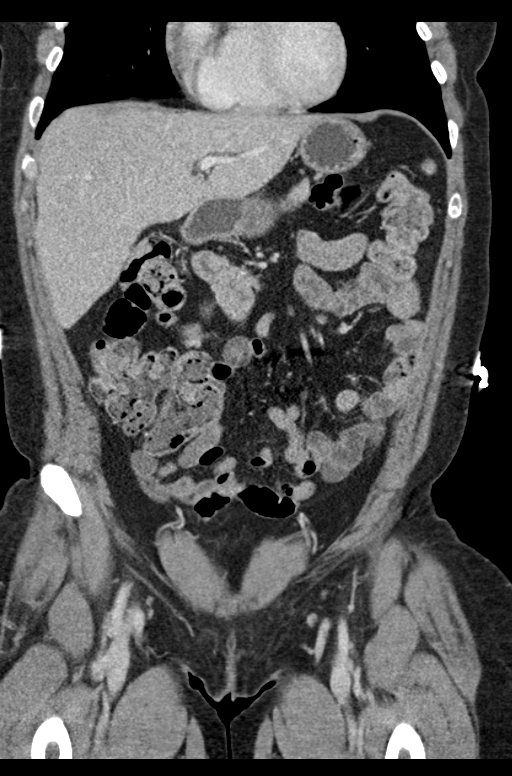
[im 53/119  soft-tissue]
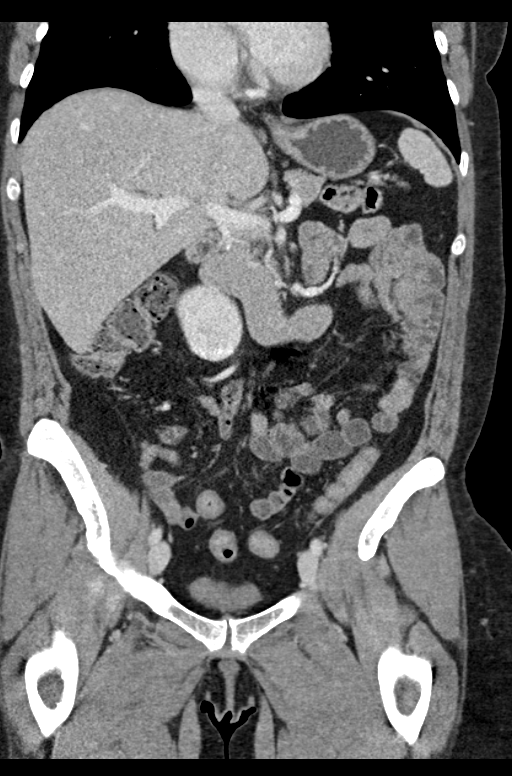
[im 66/119  soft-tissue]
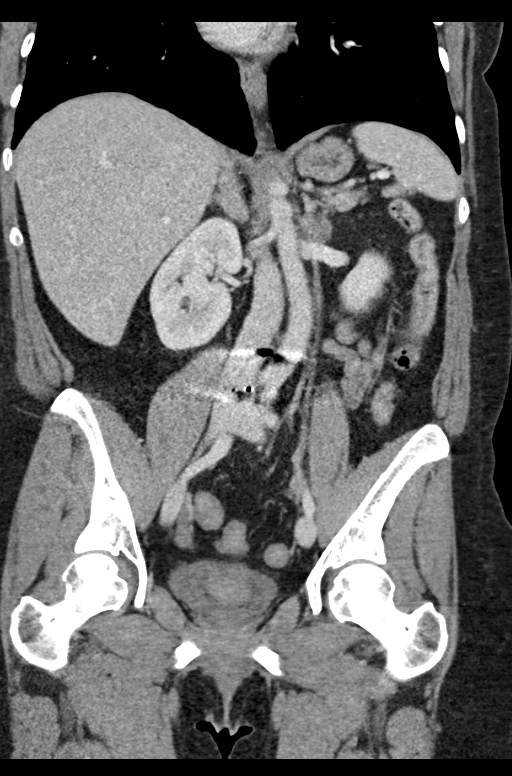

[15 of 46 positions shown; findings below may reference images not displayed]

FINDINGS: Included view of the lung bases are clear. Visualized heart and
pericardium are unremarkable.

The liver, spleen, pancreas and adrenal glands are unremarkable.
Status post cholecystectomy.

The stomach, small and large bowel are normal in course and caliber
without inflammatory changes. Normal appendix. No intraperitoneal
free fluid nor free air.

Kidneys are orthotopic, demonstrating symmetric enhancement without
nephrolithiasis, hydronephrosis or renal masses. The unopacified
ureters are normal in course and caliber. Delayed imaging through
the kidneys demonstrates symmetric prompt excretion to the proximal
urinary collecting system. Urinary bladder is decompressed and
unremarkable.

Great vessels are normal in course and caliber. No lymphadenopathy
by CT size criteria. Internal reproductive organs are nonsuspicious,
apparent tubal ligation clips. Right adnexal cyst was 3 cm, now 10
mm . Levoscoliosis, L4-5 PLIF. Tiny fat containing umbilical hernia.
IMPRESSION: No acute intra-abdominal or pelvic process.

Status post cholecystectomy.

  By: Ma Guadalupe Eugenio

## 2015-12-11 IMAGING — CT CT L SPINE W/O CM
4 of 10 series · 12 of 33 positions shown, 14 images · non-contrast
Comparison: CT scan abdomen 06/30/2013

CLINICAL DATA: Low back pain.  Occasional right leg pain.

EXAM:
CT LUMBAR SPINE WITHOUT CONTRAST
TECHNIQUE: Multidetector CT imaging of the lumbar spine was performed without
intravenous contrast administration. Multiplanar CT image
reconstructions were also generated.

[Series 4: l spine bone · axial · 0.27mm/px · z∈[-30,+40]mm · 2 of 85 slices shown, 3 images]
[im 29/85  soft-tissue]
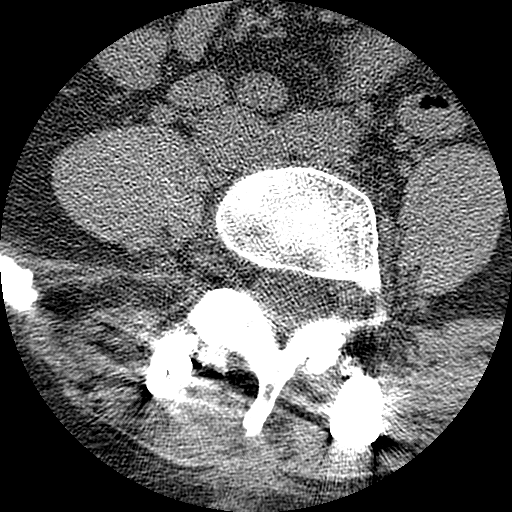
[im 29/85  bone]
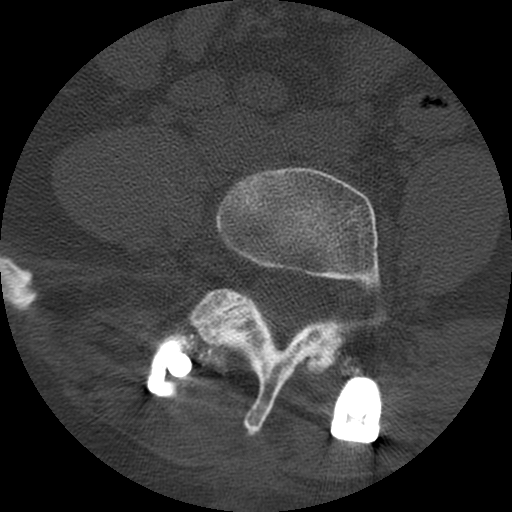
[im 57/85  bone]
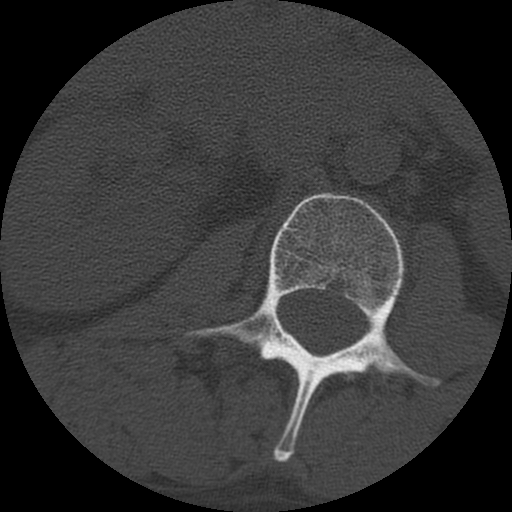

[Series 5: l spine detail · axial · 0.27mm/px · z∈[-30,+40]mm · 2 of 84 slices shown]
[im 28/84  bone]
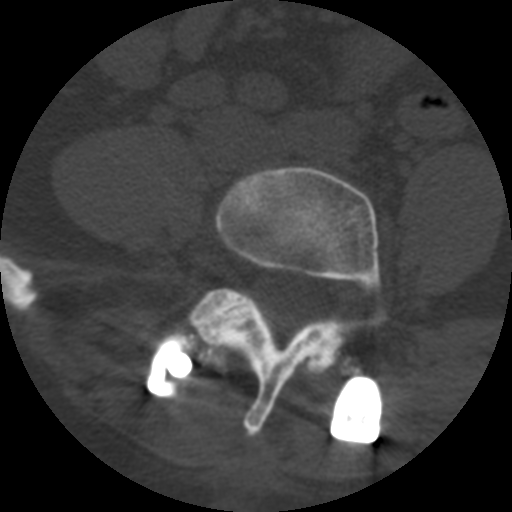
[im 56/84  bone]
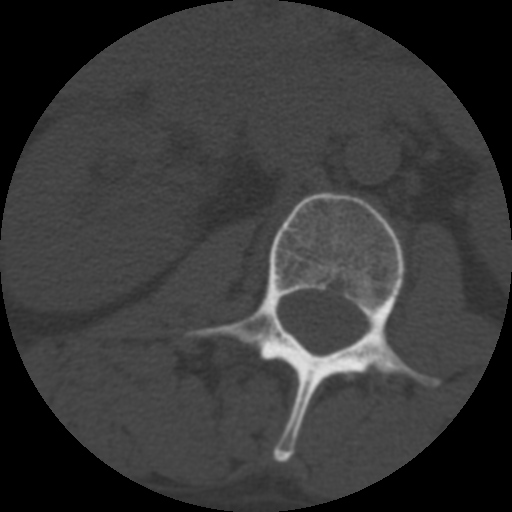

[Series 200: coronal · coronal · 0.42mm/px · 3 of 45 slices shown]
[im 9/45  bone]
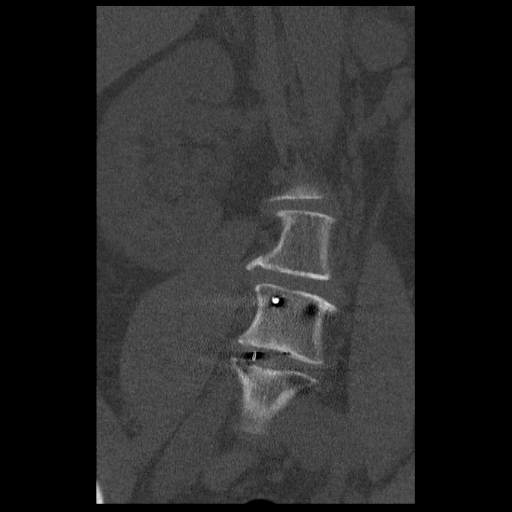
[im 18/45  bone]
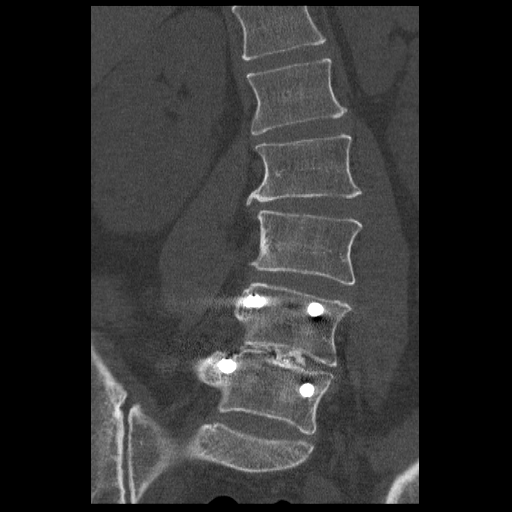
[im 27/45  bone]
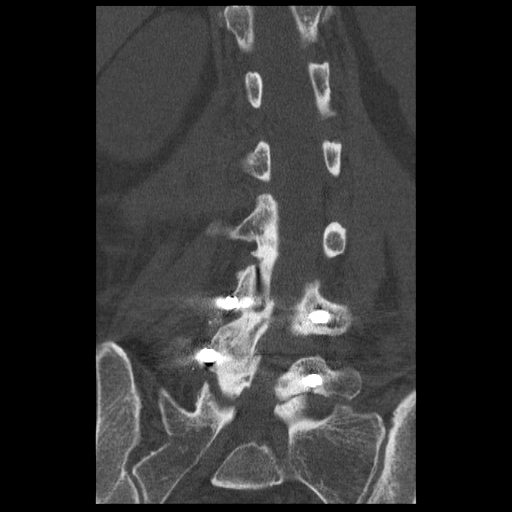

[Series 201: sagittal · sagittal · 0.42mm/px · 5 of 51 slices shown, 6 images]
[im 17/51  bone]
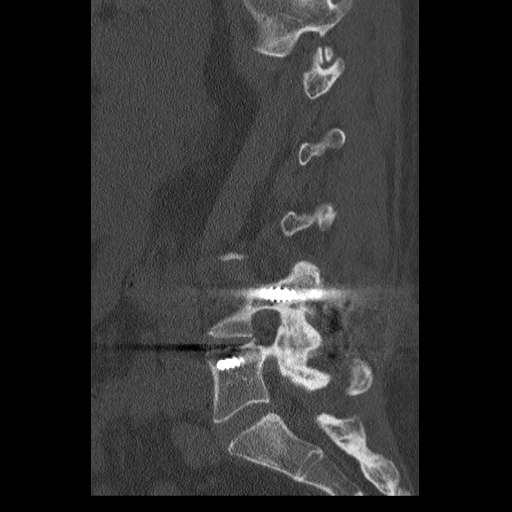
[im 21/51  bone]
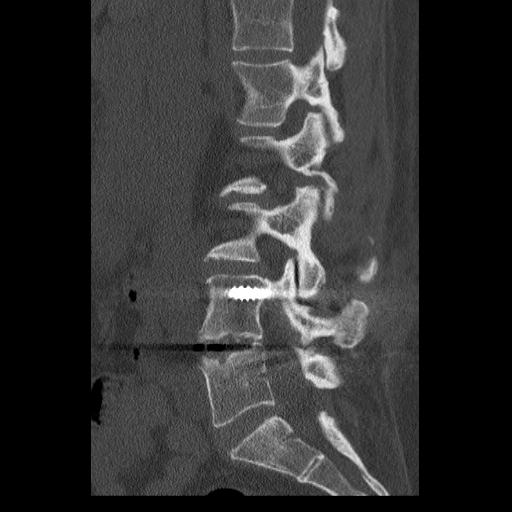
[im 26/51  soft-tissue]
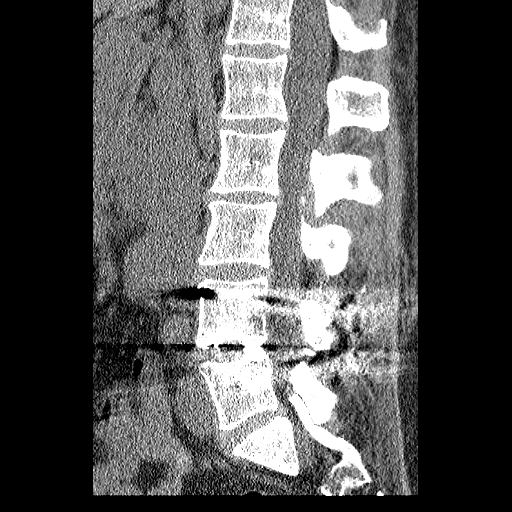
[im 26/51  bone]
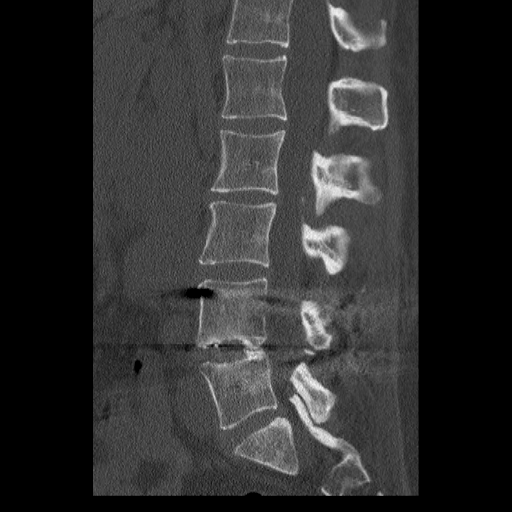
[im 30/51  bone]
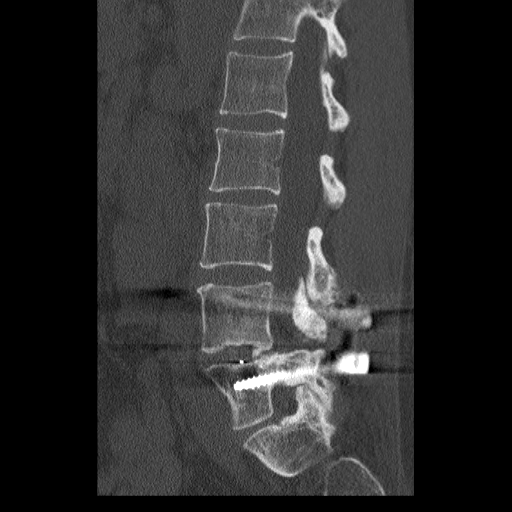
[im 34/51  bone]
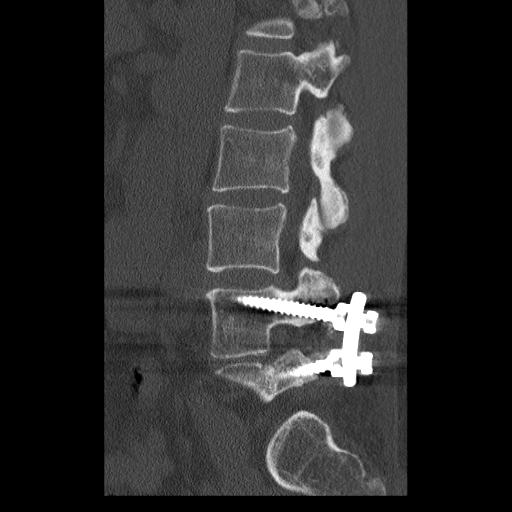

[12 of 33 positions shown; findings below may reference images not displayed]

FINDINGS: There is a lumbar scoliosis with convexity to the left centered at
L2-3, 27 degrees.

T12-L1:  Normal.

L1-2:  Normal.

L2-3: Normal discs. Slight degenerative changes of the right facet
joint.

L3-4: Tiny diffuse disc bulge. Moderate degenerative changes of the
right facet joint.

L4-5: Solid interbody and posterior fusion. Pedicle screws
demonstrate no evidence of loosening. Hardware is intact. No neural
impingement. Widely patent neural foramina.

L5-S1: Moderate bilateral facet arthritis. Tiny broad-based bulge of
the disc with no neural impingement. Widely patent neural foramina.
IMPRESSION: Solid fusion at L4-5 with no residual neural impingement.

Moderate bilateral facet arthritis at L5-S1 and of the right facet
joint at L3-4.

27 degree lumbar scoliosis.

No significant change since the CT scan dated 06/30/2013.

## 2017-01-03 ENCOUNTER — Institutional Professional Consult (permissible substitution): Payer: Self-pay | Admitting: Emergency Medicine

## 2017-01-22 ENCOUNTER — Emergency Department (HOSPITAL_COMMUNITY)
Admission: EM | Admit: 2017-01-22 | Discharge: 2017-01-22 | Disposition: A | Discharge status: Home / Self Care | Payer: Managed Care, Other (non HMO) | Attending: Emergency Medicine | Admitting: Emergency Medicine

## 2017-01-22 ENCOUNTER — Encounter (HOSPITAL_COMMUNITY): Payer: Self-pay | Admitting: Nurse Practitioner

## 2017-01-22 DIAGNOSIS — R300 Dysuria: Secondary | ICD-10-CM | POA: Insufficient documentation

## 2017-01-22 DIAGNOSIS — Z5321 Procedure and treatment not carried out due to patient leaving prior to being seen by health care provider: Secondary | ICD-10-CM | POA: Diagnosis not present

## 2017-01-22 DIAGNOSIS — R109 Unspecified abdominal pain: Secondary | ICD-10-CM | POA: Diagnosis present

## 2017-01-22 LAB — COMPREHENSIVE METABOLIC PANEL
ALBUMIN: 4.3 g/dL (ref 3.5–5.0)
ALT: 23 U/L (ref 14–54)
AST: 28 U/L (ref 15–41)
Alkaline Phosphatase: 107 U/L (ref 38–126)
Anion gap: 9 (ref 5–15)
BILIRUBIN TOTAL: 0.5 mg/dL (ref 0.3–1.2)
BUN: 13 mg/dL (ref 6–20)
CO2: 26 mmol/L (ref 22–32)
CREATININE: 0.91 mg/dL (ref 0.44–1.00)
Calcium: 9.3 mg/dL (ref 8.9–10.3)
Chloride: 103 mmol/L (ref 101–111)
GFR calc Af Amer: >60 mL/min (ref >60)
GLUCOSE: 105 mg/dL — AB (ref 65–99)
POTASSIUM: 3.5 mmol/L (ref 3.5–5.1)
Sodium: 138 mmol/L (ref 135–145)
Total Protein: 7.1 g/dL (ref 6.5–8.1)

## 2017-01-22 LAB — CBC
HEMATOCRIT: 37.6 % (ref 36.0–46.0)
Hemoglobin: 12.5 g/dL (ref 12.0–15.0)
MCH: 31.3 pg (ref 26.0–34.0)
MCHC: 33.2 g/dL (ref 30.0–36.0)
MCV: 94 fL (ref 78.0–100.0)
Platelets: 387 10*3/uL (ref 150–400)
RBC: 4 MIL/uL (ref 3.87–5.11)
RDW: 13.8 % (ref 11.5–15.5)
WBC: 9.1 10*3/uL (ref 4.0–10.5)

## 2017-01-22 LAB — LIPASE, BLOOD: Lipase: 27 U/L (ref 11–51)

## 2017-01-22 NOTE — ED Notes (Signed)
Pt called from the lobby with no response 

## 2017-01-22 NOTE — ED Triage Notes (Signed)
Pt is c/o dysuria and suprapubic pain. States that on 07/27/2016 she had a sigmonscopy procedure that may have increased chances of uti. States moments PTA today, she had an episode of diaphoresis severe suprapubic pain. She is also on oral chemo lukemia.

## 2017-01-22 NOTE — ED Notes (Signed)
Pt had drawn for labs:  Blue Gold Lavender Lt green Dark green x2 

## 2017-05-12 IMAGING — CR DG ABDOMEN ACUTE W/ 1V CHEST
3 series · 3 of 3 positions shown · non-contrast
Comparison: Abdominal CT 05/02/2015

CLINICAL DATA: Constipation and periumbilical pain

EXAM:
DG ABDOMEN ACUTE W/ 1V CHEST

[chest pa]
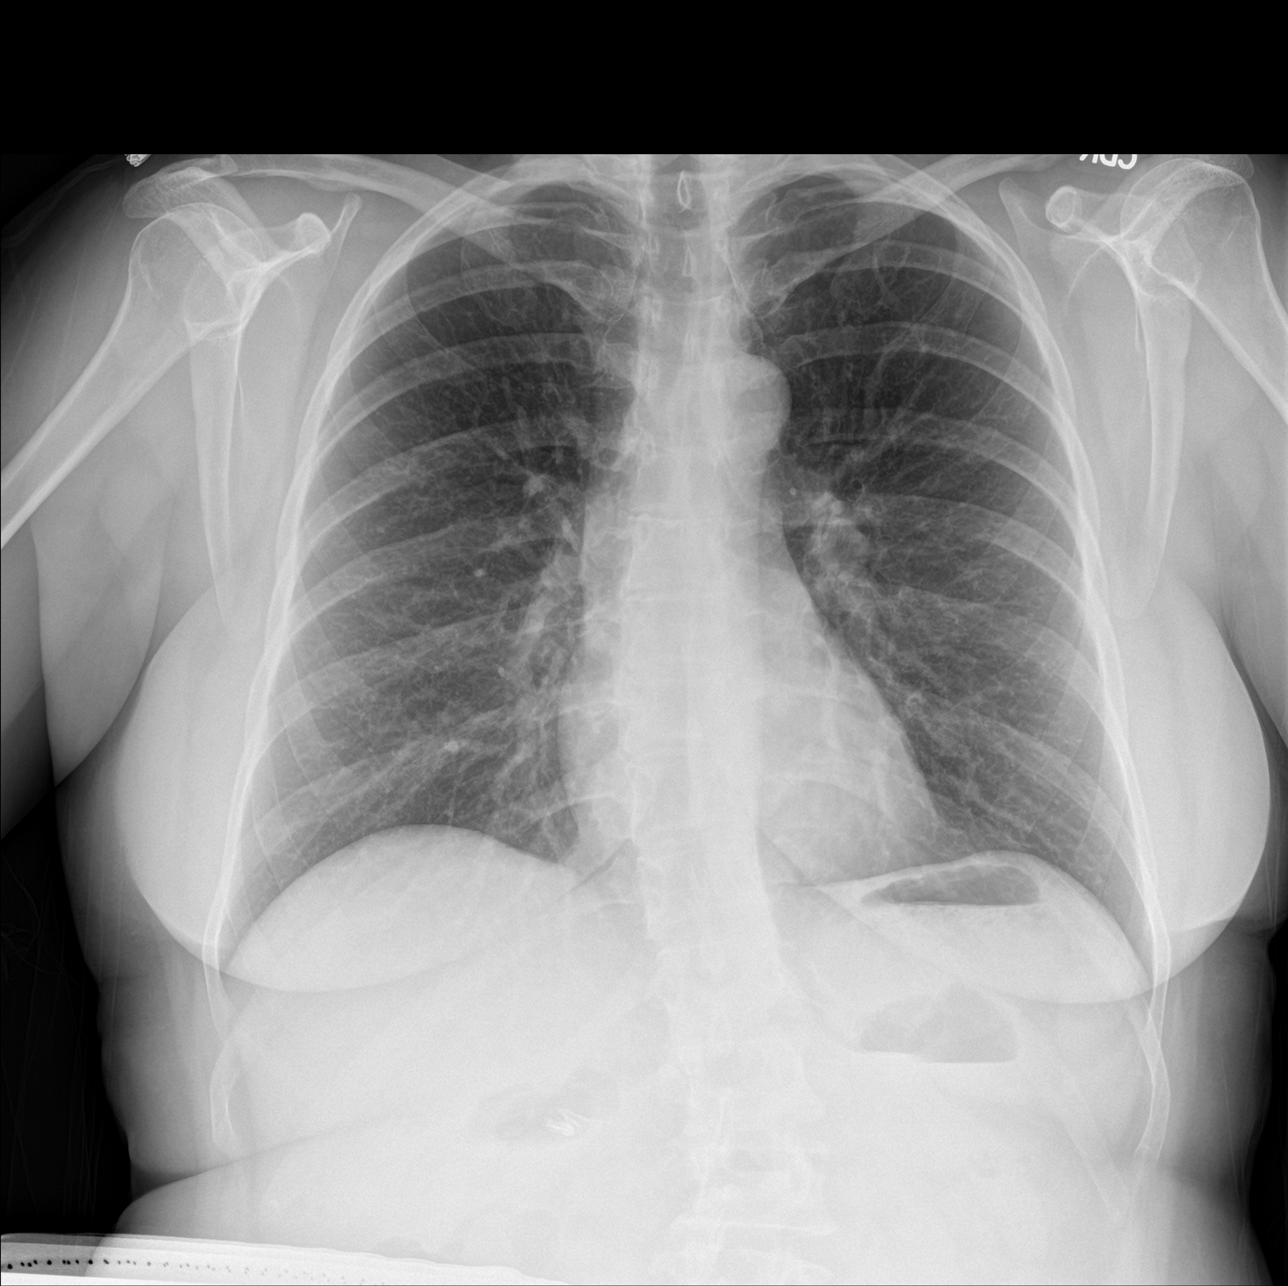

[abdomen erect]
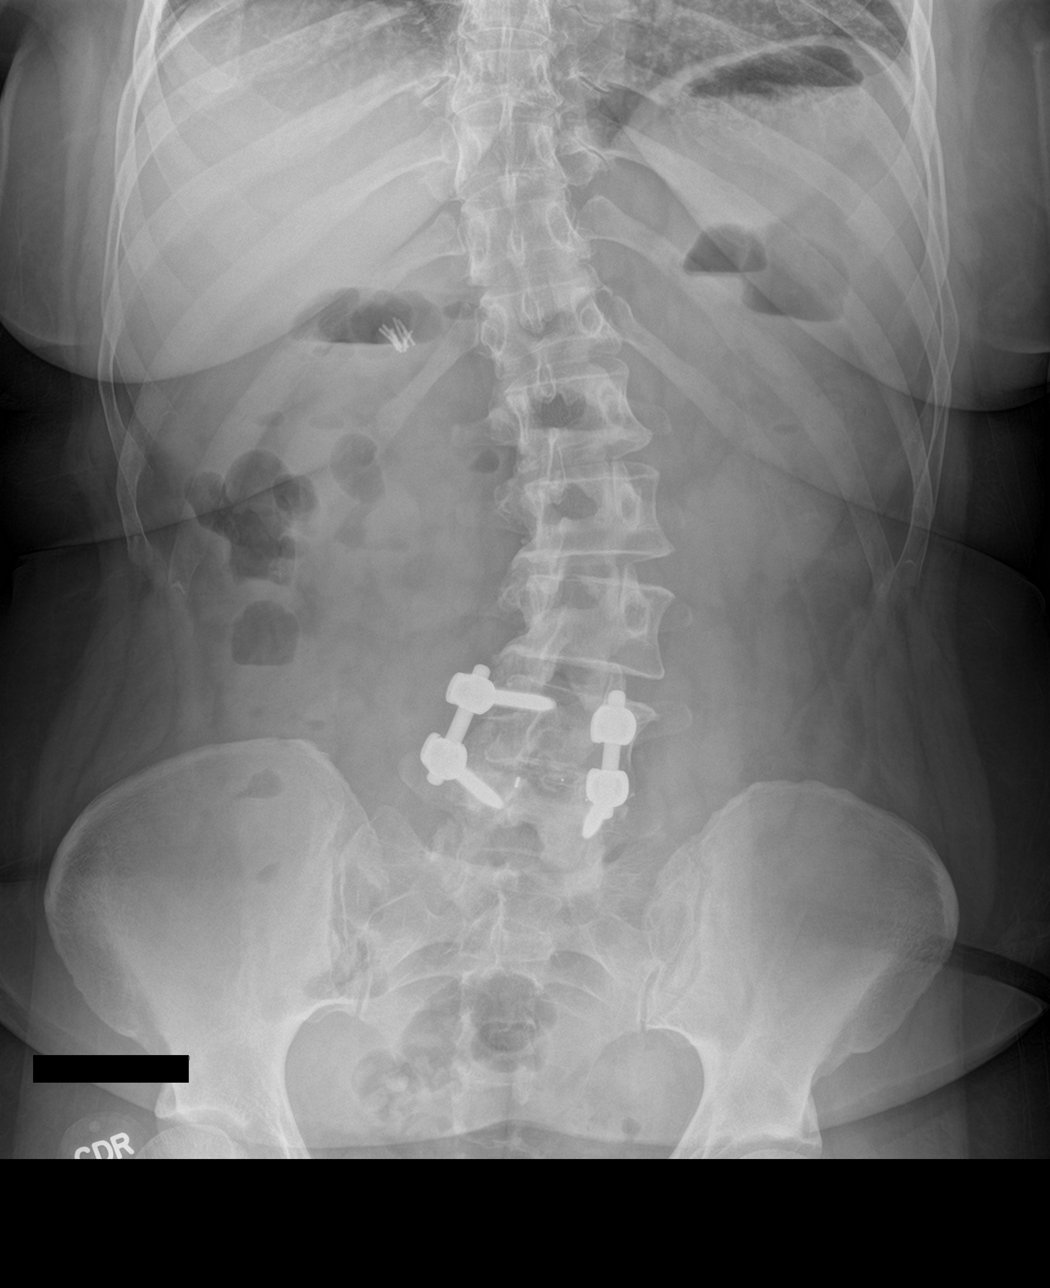

[abdomen supine]
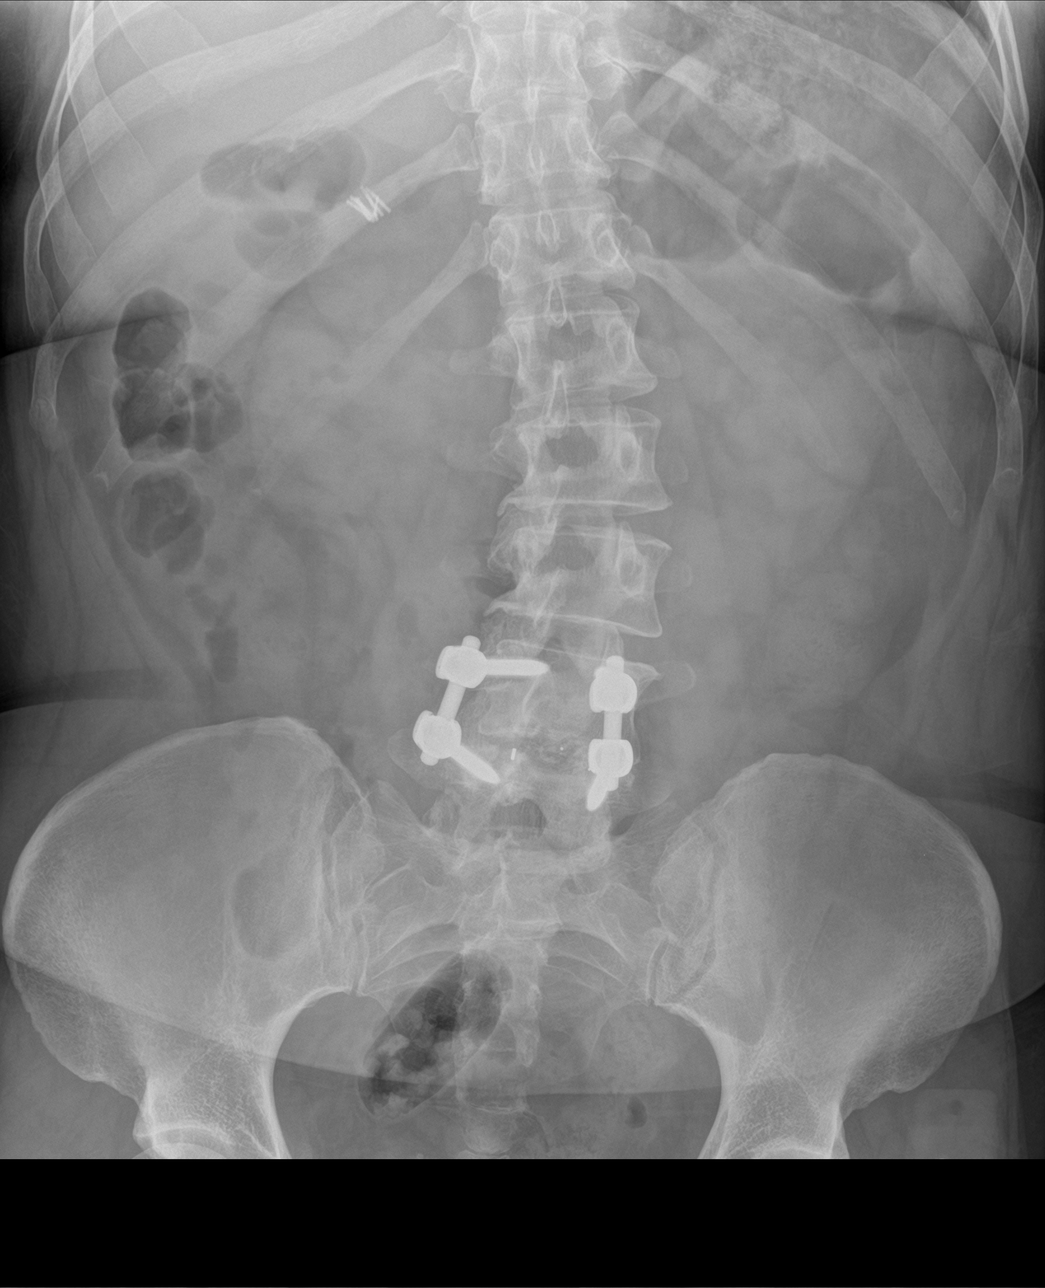

[3 of 3 positions shown; findings below may reference images not displayed]

FINDINGS: There is no evidence of dilated bowel loops or free intraperitoneal
air. No radiopaque calculi or other significant radiographic
abnormality is seen. Heart size and mediastinal contours are within
normal limits. Both lungs are clear.

S shaped thoracolumbar scoliosis. L4-5 PLIF. Cholecystectomy clips.
IMPRESSION: Negative abdominal radiographs. No stool retention to correlate with
constipation history.

No acute cardiopulmonary disease.

## 2023-11-24 ENCOUNTER — Encounter (HOSPITAL_BASED_OUTPATIENT_CLINIC_OR_DEPARTMENT_OTHER): Payer: Self-pay | Admitting: Emergency Medicine

## 2023-11-24 ENCOUNTER — Other Ambulatory Visit: Payer: Self-pay

## 2023-11-24 ENCOUNTER — Emergency Department (HOSPITAL_BASED_OUTPATIENT_CLINIC_OR_DEPARTMENT_OTHER)
Admission: EM | Admit: 2023-11-24 | Discharge: 2023-11-24 | Disposition: A | Discharge status: Home / Self Care | Attending: Emergency Medicine | Admitting: Emergency Medicine

## 2023-11-24 DIAGNOSIS — L0390 Cellulitis, unspecified: Secondary | ICD-10-CM

## 2023-11-24 DIAGNOSIS — L03114 Cellulitis of left upper limb: Secondary | ICD-10-CM | POA: Diagnosis present

## 2023-11-24 DIAGNOSIS — Z91040 Latex allergy status: Secondary | ICD-10-CM | POA: Insufficient documentation

## 2023-11-24 HISTORY — DX: Chronic myeloid leukemia, BCR/ABL-positive, not having achieved remission: C92.10

## 2023-11-24 MED ORDER — CLINDAMYCIN HCL 150 MG PO CAPS
300.0000 mg | ORAL_CAPSULE | Freq: Once | ORAL | Status: AC
  Administered 2023-11-24: 300 mg via ORAL
  Filled 2023-11-24: qty 2

## 2023-11-24 MED ORDER — CLINDAMYCIN HCL 150 MG PO CAPS: 300.0000 mg | ORAL_CAPSULE | Freq: Three times a day (TID) | ORAL | 0 refills | Status: AC

## 2023-11-24 NOTE — ED Provider Notes (Signed)
 [place] EMERGENCY DEPARTMENT AT [facility] Provider Note   CSN: [id number] Arrival date & time: 11/24/23  8167     Patient presents with: Wound Check   Mariah Reyes is a 53 y.o. female.   Patient here for wound check on cellulitis of her left forearm.  States that she thinks she got them from fleabites on vacation recently.  She has multiple antibiotic allergy.  She was taking some old clindamycin .  Denies any fever or chills.  Nothing makes it worse or better.  Denies any chest pain shortness of breath weakness numbness tingling.  She has a history of pancreatitis CML.  The history is provided by the patient.       Prior to Admission medications   Medication Sig Start Date End Date Taking? Authorizing Provider  clindamycin  (CLEOCIN ) 150 MG capsule Take 2 capsules (300 mg total) by mouth 3 (three) times daily for 10 days. 11/24/23 12/04/23 Yes Kanyah Matsushima, DO  acetaminophen  (TYLENOL ) 500 MG tablet Take 1,000 mg by mouth 2 (two) times daily as needed for mild pain.     [provider]  albuterol  (PROVENTIL  HFA;VENTOLIN  HFA) 108 (90 BASE) MCG/ACT inhaler Inhale 2 puffs into the lungs every 6 (six) hours as needed for wheezing or shortness of breath.    [provider]  ALPRAZolam  (XANAX  XR) 0.5 MG 24 hr tablet Take 0.5 mg by mouth 2 (two) times daily.    [provider]  amphetamine -dextroamphetamine  (ADDERALL  XR) 20 MG 24 hr capsule Take 20 mg by mouth daily. 09/26/14   [provider]  Dapsone  5 % topical gel Apply 1 application topically 2 (two) times daily. 11/19/13   Withrow, Norleen BROCKS, FNP  diphenhydrAMINE  (BENADRYL ) 25 mg capsule Take 25 mg by mouth every 6 (six) hours as needed (itching).    [provider]  EPINEPHrine (EPIPEN 2-PAK) 0.3 mg/0.3 mL IJ SOAJ injection Inject 0.3 mg into the muscle once.    [provider]  FLUoxetine  HCl 60 MG TABS Take 60 mg by mouth daily. 11/19/13   Withrow, Norleen BROCKS, FNP  fluticasone   (FLOVENT  HFA) 110 MCG/ACT inhaler Inhale 2 puffs into the lungs 2 (two) times daily. 11/19/13   Withrow, Norleen BROCKS, FNP  gabapentin  (NEURONTIN ) 100 MG capsule Take 1 capsule (100 mg total) by mouth 3 (three) times daily. Patient not taking: Reported on 06/05/2015 11/19/13   Rowland Norleen BROCKS, FNP  levETIRAcetam (KEPPRA) 250 MG tablet Take 250 mg by mouth at bedtime.    [provider]  levothyroxine  (SYNTHROID , LEVOTHROID) 50 MCG tablet Take 1 tablet (50 mcg total) by mouth daily before breakfast. 11/19/13   Withrow, Norleen BROCKS, FNP  Meth-Hyo-M Bl-Na Phos-Ph Sal (URIBEL PO) Take 1 tablet by mouth 4 (four) times daily as needed (bladder pain).    [provider]  naproxen  sodium (ANAPROX ) 220 MG tablet Take 220 mg by mouth 2 (two) times daily as needed (pain).    [provider]  Probiotic Product (PROBIOTIC PO) Take 1 capsule by mouth daily.    [provider]  pseudoephedrine (SUDAFED) 120 MG 12 hr tablet Take 120 mg by mouth daily as needed for congestion.    [provider]  tiZANidine (ZANAFLEX) 4 MG tablet Take 4 mg by mouth 3 (three) times daily as needed for muscle spasms.  04/26/15   [provider]  zolpidem  (AMBIEN  CR) 12.5 MG CR tablet Take 1 tablet (12.5 mg total) by mouth at bedtime. 11/19/13   Withrow, Norleen  C, FNP    Allergies: Keflex  [cephalexin ], Latex, Nickel, Sulfa antibiotics, Other, Ciprofloxacin hcl, and Doxycycline    Review of Systems  Updated Vital Signs BP (!) 148/101   Pulse 80   Temp 98.3 F (36.8 C) (Oral)   Resp 20   LMP 09/15/2013   SpO2 99%   Physical Exam Vitals and nursing note reviewed.  Constitutional:      General: She is not in acute distress.    Appearance: She is well-developed. She is not ill-appearing.  HENT:     Head: Normocephalic and atraumatic.     Nose: Nose normal.     Mouth/Throat:     Mouth: Mucous membranes are moist.  Eyes:     Extraocular Movements: Extraocular movements intact.      Conjunctiva/sclera: Conjunctivae normal.     Pupils: Pupils are equal, round, and reactive to light.  Cardiovascular:     Rate and Rhythm: Normal rate and regular rhythm.     Pulses: Normal pulses.     Heart sounds: Normal heart sounds. No murmur heard. Pulmonary:     Effort: Pulmonary effort is normal. No respiratory distress.     Breath sounds: Normal breath sounds.  Abdominal:     Palpations: Abdomen is soft.     Tenderness: There is no abdominal tenderness.  Musculoskeletal:        General: No swelling.     Cervical back: Normal range of motion and neck supple.  Skin:    General: Skin is warm and dry.     Capillary Refill: Capillary refill takes less than 2 seconds.     Findings: Erythema present.     Comments: She has a little blistering to the left forearm with some surrounding cellulitis but no purulent drainage or abscess and no crepitus  Neurological:     Mental Status: She is alert.  Psychiatric:        Mood and Affect: Mood normal.     (all labs ordered are listed, but only abnormal results are displayed) Labs Reviewed - No data to display  EKG: None  Radiology: No results found.   Procedures   Medications Ordered in the ED  clindamycin  (CLEOCIN ) capsule 300 mg (has no administration in time range)                                    Medical Decision Making Risk Prescription drug management.   Mariah Reyes is here for evaluation of wound on her left forearm.  History of CML pancreatitis.  Overall unremarkable vitals.  No fever.  She has a very localized infectious process in the left forearm.  There is no crepitus.  Have no concern for abscess or necrotizing fasciitis.  She is neurovascular neuromuscular intact on exam.  I do not think there is any systemic infectious process.  Sounds like maybe she was taking some Bactrim and topical antibiotics but she has allergy to sulfa.  Will switch her to clindamycin .  She is taking some old clindamycin  but overall  we will give her a new prescription for clindamycin  and recommend bacitracin or Bactroban  ointment which she has twice daily.  Follow-up with primary care for wound check.  Return if symptoms worsen.  Discharge.  This chart was dictated using voice recognition software.  Despite best efforts to proofread,  errors can occur which can change the documentation meaning.      Final diagnoses:  Cellulitis, unspecified cellulitis site    ED Discharge Orders          Ordered    clindamycin  (CLEOCIN ) 150 MG capsule  3 times daily        11/24/23 1854               Ruthe Cornet, DO 11/24/23 1856

## 2023-11-24 NOTE — ED Triage Notes (Signed)
 Right knee swelling for some time Improved per patient Weird symptoms since may Reaction to sand flea bites Sore on arm  Was on abt in July ( I am allergic to almost all abt

## 2023-12-15 ENCOUNTER — Other Ambulatory Visit (HOSPITAL_BASED_OUTPATIENT_CLINIC_OR_DEPARTMENT_OTHER): Payer: Self-pay

## 2023-12-15 MED ORDER — OXYCONTIN 10 MG PO T12A
10.0000 mg | EXTENDED_RELEASE_TABLET | Freq: Three times a day (TID) | ORAL | 0 refills | Status: AC
  Filled 2023-12-15 – 2023-12-28 (×2): qty 45, 15d supply, fill #0

## 2023-12-26 ENCOUNTER — Other Ambulatory Visit (HOSPITAL_BASED_OUTPATIENT_CLINIC_OR_DEPARTMENT_OTHER): Payer: Self-pay

## 2023-12-26 MED ORDER — CLONIDINE HCL 0.1 MG PO TABS
0.1000 mg | ORAL_TABLET | Freq: Three times a day (TID) | ORAL | 1 refills | Status: AC | PRN
  Filled 2023-12-26: qty 45, 15d supply, fill #0

## 2023-12-26 MED ORDER — BUPRENORPHINE HCL-NALOXONE HCL 8-2 MG SL FILM
ORAL_FILM | SUBLINGUAL | 0 refills | Status: DC
  Filled 2023-12-26: qty 18, 7d supply, fill #0

## 2023-12-28 ENCOUNTER — Other Ambulatory Visit (HOSPITAL_BASED_OUTPATIENT_CLINIC_OR_DEPARTMENT_OTHER): Payer: Self-pay

## 2024-01-02 ENCOUNTER — Other Ambulatory Visit (HOSPITAL_BASED_OUTPATIENT_CLINIC_OR_DEPARTMENT_OTHER): Payer: Self-pay

## 2024-01-02 MED ORDER — BUPRENORPHINE HCL-NALOXONE HCL 8-2 MG SL FILM
ORAL_FILM | SUBLINGUAL | 0 refills | Status: DC
  Filled 2024-01-02: qty 35, 14d supply, fill #0

## 2024-01-09 ENCOUNTER — Other Ambulatory Visit (HOSPITAL_BASED_OUTPATIENT_CLINIC_OR_DEPARTMENT_OTHER): Payer: Self-pay

## 2024-01-09 MED ORDER — MUPIROCIN CALCIUM 2 % EX CREA
1.0000 | TOPICAL_CREAM | Freq: Three times a day (TID) | CUTANEOUS | 0 refills | Status: AC
  Filled 2024-01-09: qty 15, 7d supply, fill #0

## 2024-01-09 MED ORDER — CLINDAMYCIN HCL 150 MG PO CAPS
450.0000 mg | ORAL_CAPSULE | Freq: Three times a day (TID) | ORAL | 0 refills | Status: AC
  Filled 2024-01-09: qty 63, 7d supply, fill #0

## 2024-01-10 ENCOUNTER — Emergency Department (HOSPITAL_BASED_OUTPATIENT_CLINIC_OR_DEPARTMENT_OTHER)
Admission: EM | Admit: 2024-01-10 | Discharge: 2024-01-10 | Disposition: A | Discharge status: Home / Self Care | Attending: Emergency Medicine | Admitting: Emergency Medicine

## 2024-01-10 ENCOUNTER — Other Ambulatory Visit (HOSPITAL_BASED_OUTPATIENT_CLINIC_OR_DEPARTMENT_OTHER): Payer: Self-pay

## 2024-01-10 ENCOUNTER — Other Ambulatory Visit: Payer: Self-pay

## 2024-01-10 ENCOUNTER — Emergency Department (HOSPITAL_BASED_OUTPATIENT_CLINIC_OR_DEPARTMENT_OTHER)

## 2024-01-10 DIAGNOSIS — R55 Syncope and collapse: Secondary | ICD-10-CM | POA: Diagnosis not present

## 2024-01-10 DIAGNOSIS — L089 Local infection of the skin and subcutaneous tissue, unspecified: Secondary | ICD-10-CM | POA: Diagnosis present

## 2024-01-10 DIAGNOSIS — Z856 Personal history of leukemia: Secondary | ICD-10-CM | POA: Insufficient documentation

## 2024-01-10 DIAGNOSIS — Z91040 Latex allergy status: Secondary | ICD-10-CM | POA: Insufficient documentation

## 2024-01-10 LAB — CBC WITH DIFFERENTIAL/PLATELET
Abs Immature Granulocytes: 0.01 K/uL (ref 0.00–0.07)
Basophils Absolute: 0.1 K/uL (ref 0.0–0.1)
Basophils Relative: 1 %
Eosinophils Absolute: 0.7 K/uL — ABNORMAL HIGH (ref 0.0–0.5)
Eosinophils Relative: 11 %
HCT: 29.4 % — ABNORMAL LOW (ref 36.0–46.0)
Hemoglobin: 9.5 g/dL — ABNORMAL LOW (ref 12.0–15.0)
Immature Granulocytes: 0 %
Lymphocytes Relative: 25 %
Lymphs Abs: 1.6 K/uL (ref 0.7–4.0)
MCH: 32.6 pg (ref 26.0–34.0)
MCHC: 32.3 g/dL (ref 30.0–36.0)
MCV: 101 fL — ABNORMAL HIGH (ref 80.0–100.0)
Monocytes Absolute: 0.5 K/uL (ref 0.1–1.0)
Monocytes Relative: 8 %
Neutro Abs: 3.5 K/uL (ref 1.7–7.7)
Neutrophils Relative %: 55 %
Platelets: 334 K/uL (ref 150–400)
RBC: 2.91 MIL/uL — ABNORMAL LOW (ref 3.87–5.11)
RDW: 16.1 % — ABNORMAL HIGH (ref 11.5–15.5)
WBC: 6.2 K/uL (ref 4.0–10.5)
nRBC: 0 % (ref 0.0–0.2)

## 2024-01-10 LAB — COMPREHENSIVE METABOLIC PANEL WITH GFR
ALT: 33 U/L (ref 0–44)
AST: 46 U/L — ABNORMAL HIGH (ref 15–41)
Albumin: 3.9 g/dL (ref 3.5–5.0)
Alkaline Phosphatase: 96 U/L (ref 38–126)
Anion gap: 12 (ref 5–15)
BUN: 10 mg/dL (ref 6–20)
CO2: 24 mmol/L (ref 22–32)
Calcium: 8.9 mg/dL (ref 8.9–10.3)
Chloride: 104 mmol/L (ref 98–111)
Creatinine, Ser: 0.61 mg/dL (ref 0.44–1.00)
GFR, Estimated: >60 mL/min (ref >60)
Glucose, Bld: 123 mg/dL — ABNORMAL HIGH (ref 70–99)
Potassium: 3.9 mmol/L (ref 3.5–5.1)
Sodium: 140 mmol/L (ref 135–145)
Total Bilirubin: 0.2 mg/dL (ref 0.0–1.2)
Total Protein: 6.1 g/dL — ABNORMAL LOW (ref 6.5–8.1)

## 2024-01-10 MED ORDER — DIAZEPAM 5 MG PO TABS
10.0000 mg | ORAL_TABLET | ORAL | 0 refills | Status: DC
  Filled 2024-01-10: qty 2, 1d supply, fill #0

## 2024-01-10 NOTE — ED Provider Notes (Signed)
 [place] EMERGENCY DEPARTMENT AT [facility] Provider Note   CSN: [id number] Arrival date & time: 01/10/24  1337     Patient presents with: Weakness   Mariah Reyes is a 53 y.o. female.   Patient here after near syncopal episode this morning.  She has been dealing with some recurrent skin infections.  She supposed to start another course of clindamycin  for a wound on her left forearm.  She has a history of CML on maintenance meds.  History of anxiety depression.  Denies any fever chest pain shortness of breath.  No headache.  She did not pass out.    The history is provided by the patient.       Prior to Admission medications   Medication Sig Start Date End Date Taking? Authorizing Provider  BRIXADI  128 MG/0.36ML SOSY  01/02/24  Yes [provider]  acetaminophen  (TYLENOL ) 500 MG tablet Take 1,000 mg by mouth 2 (two) times daily as needed for mild pain.     [provider]  albuterol  (PROVENTIL  HFA;VENTOLIN  HFA) 108 (90 BASE) MCG/ACT inhaler Inhale 2 puffs into the lungs every 6 (six) hours as needed for wheezing or shortness of breath.    [provider]  ALPRAZolam  (XANAX  XR) 0.5 MG 24 hr tablet Take 0.5 mg by mouth 2 (two) times daily.    [provider]  amphetamine -dextroamphetamine  (ADDERALL  XR) 20 MG 24 hr capsule Take 20 mg by mouth daily. 09/26/14   [provider]  budesonide (PULMICORT) 1 MG/2ML nebulizer solution Take 1 mg by nebulization See admin instructions.    [provider]  Buprenorphine  HCl-Naloxone  HCl 8-2 MG FILM Place 2 1/2 film under tongue once a day as directed 01/02/24     clindamycin  (CLEOCIN ) 150 MG capsule Take 3 capsules (450 mg total) by mouth 3 (three) times daily for 7 days. 01/09/24 01/17/24    cloNIDine  (CATAPRES ) 0.1 MG tablet Take 1 tablet (0.1 mg total) by mouth 3 (three) times daily as needed. Use for withdrawals or cravings Hold for SBP <90 or DBP <60 12/26/23     Dapsone  5 % topical gel  Apply 1 application topically 2 (two) times daily. 11/19/13   Withrow, Norleen BROCKS, FNP  diazepam  (VALIUM ) 5 MG tablet Take 2 tablets (10 mg total) by mouth 30-40 minutes prior to procedure as directed 01/10/24     diphenhydrAMINE  (BENADRYL ) 25 mg capsule Take 25 mg by mouth every 6 (six) hours as needed (itching).    [provider]  EPINEPHrine (EPIPEN 2-PAK) 0.3 mg/0.3 mL IJ SOAJ injection Inject 0.3 mg into the muscle once.    [provider]  FLUoxetine  HCl 60 MG TABS Take 60 mg by mouth daily. 11/19/13   Withrow, Norleen BROCKS, FNP  fluticasone  (FLOVENT  HFA) 110 MCG/ACT inhaler Inhale 2 puffs into the lungs 2 (two) times daily. 11/19/13   Withrow, Norleen BROCKS, FNP  gabapentin  (NEURONTIN ) 100 MG capsule Take 1 capsule (100 mg total) by mouth 3 (three) times daily. Patient not taking: Reported on 06/05/2015 11/19/13   Rowland Norleen BROCKS, FNP  imatinib (GLEEVEC) 100 MG tablet Take by mouth.    [provider]  levETIRAcetam (KEPPRA) 250 MG tablet Take 250 mg by mouth at bedtime.    [provider]  levocetirizine (XYZAL) 5 MG tablet Take 5 mg by mouth daily.    [provider]  levothyroxine  (SYNTHROID , LEVOTHROID) 50 MCG tablet Take 1 tablet (50 mcg total) by mouth daily before breakfast. 11/19/13  Withrow, John C, FNP  Meth-Hyo-M Bl-Na Phos-Ph Sal (URIBEL PO) Take 1 tablet by mouth 4 (four) times daily as needed (bladder pain).    [provider]  mupirocin  cream (BACTROBAN ) 2 % Apply 1 Application topically 3 (three) times daily for 7 days. 01/09/24 01/17/24    naproxen  sodium (ANAPROX ) 220 MG tablet Take 220 mg by mouth 2 (two) times daily as needed (pain).    [provider]  oxyCODONE  (OXYCONTIN ) 10 mg 12 hr tablet Take 1 tablet (10 mg total) by mouth 3 (three) times daily for pain 12/15/23     Probiotic Product (PROBIOTIC PO) Take 1 capsule by mouth daily.    [provider]  pseudoephedrine (SUDAFED) 120 MG 12 hr tablet Take 120 mg by mouth daily as  needed for congestion.    [provider]  tiZANidine (ZANAFLEX) 4 MG tablet Take 4 mg by mouth 3 (three) times daily as needed for muscle spasms.  04/26/15   [provider]  zolpidem  (AMBIEN  CR) 12.5 MG CR tablet Take 1 tablet (12.5 mg total) by mouth at bedtime. 11/19/13   Withrow, Norleen BROCKS, FNP    Allergies: Keflex  [cephalexin ], Latex, Nickel, Sulfa antibiotics, Other, Ciprofloxacin hcl, and Doxycycline    Review of Systems  Updated Vital Signs BP 108/73   Pulse 79   Temp 98.1 F (36.7 C) (Oral)   Resp 20   LMP 09/15/2013   SpO2 100%   Physical Exam Vitals and nursing note reviewed.  Constitutional:      General: She is not in acute distress.    Appearance: She is well-developed. She is not ill-appearing.  HENT:     Head: Normocephalic and atraumatic.     Nose: Nose normal.     Mouth/Throat:     Mouth: Mucous membranes are moist.  Eyes:     Extraocular Movements: Extraocular movements intact.     Conjunctiva/sclera: Conjunctivae normal.     Pupils: Pupils are equal, round, and reactive to light.  Cardiovascular:     Rate and Rhythm: Normal rate and regular rhythm.     Pulses: Normal pulses.     Heart sounds: Normal heart sounds. No murmur heard. Pulmonary:     Effort: Pulmonary effort is normal. No respiratory distress.     Breath sounds: Normal breath sounds.  Abdominal:     General: Abdomen is flat.     Palpations: Abdomen is soft.     Tenderness: There is no abdominal tenderness.  Musculoskeletal:        General: No swelling.     Cervical back: Normal range of motion and neck supple.  Skin:    General: Skin is warm and dry.     Capillary Refill: Capillary refill takes less than 2 seconds.     Comments: Wound to the left forearm that is focal redness but there is no purulent drainage or fluctuance  Neurological:     General: No focal deficit present.     Mental Status: She is alert and oriented to person, place, and time.     Cranial Nerves: No  cranial nerve deficit.     Sensory: No sensory deficit.     Motor: No weakness.     Coordination: Coordination normal.  Psychiatric:        Mood and Affect: Mood normal.     (all labs ordered are listed, but only abnormal results are displayed) Labs Reviewed  CBC WITH DIFFERENTIAL/PLATELET - Abnormal; Notable for the following components:  Result Value   RBC 2.91 (*)    Hemoglobin 9.5 (*)    HCT 29.4 (*)    MCV 101.0 (*)    RDW 16.1 (*)    Eosinophils Absolute 0.7 (*)    All other components within normal limits  COMPREHENSIVE METABOLIC PANEL WITH GFR - Abnormal; Notable for the following components:   Glucose, Bld 123 (*)    Total Protein 6.1 (*)    AST 46 (*)    All other components within normal limits    EKG: EKG Interpretation Date/Time:  Tuesday January 10 2024 13:54:05 EDT Ventricular Rate:  80 PR Interval:  154 QRS Duration:  91 QT Interval:  394 QTC Calculation: 455 R Axis:   27  Text Interpretation: Sinus rhythm Low voltage, precordial leads Abnormal R-wave progression, early transition Confirmed by Ruthe Cornet [phone]) on 01/10/2024 3:17:10 PM  Radiology: [facility] Chest Portable 1 View Result Date: 01/10/2024 EXAM: 1 VIEW(S) XRAY OF THE CHEST 01/10/2024 03:30:00 PM COMPARISON: 06/05/2015 CLINICAL HISTORY: weakness. Weakness, recent treatment for staph FINDINGS: LUNGS AND PLEURA: No focal pulmonary opacity. No pulmonary edema. No pleural effusion. No pneumothorax. HEART AND MEDIASTINUM: No acute abnormality of the cardiac and mediastinal silhouettes. BONES AND SOFT TISSUES: Moderate thoracolumbar scoliosis. IMPRESSION: 1. No acute cardiopulmonary findings. Electronically signed by: Waddell Calk MD 01/10/2024 04:15 PM EDT RP Workstation: [id number]     Procedures   Medications Ordered in the ED - No data to display                                  Medical Decision Making Amount and/or Complexity of Data Reviewed Labs: ordered. Radiology:  ordered.   Sherrey North is here with near syncope episode.  History of CML on maintenance meds, anemia.  Unremarkable vitals.  No fever.  Was picking up antibiotic today as she has had some recurrent skin infections.  She is supposed to start clindamycin .  She has small wound on the left forearm that I think clindamycin  will work well on.  Recommend topical antibiotic as well.  She had near syncopal episode today but did not pass out.  EKG shows sinus rhythm.  No ischemic changes.  Will check basic labs to make sure she is not anemic dehydrated or any other emergent process.   Lab work overall unremarkable.  No significant leukocytosis anemia or electrolyte abnormality.  Chest x-ray shows no evidence of pneumonia or other acute process.  Ultimately I do not think she has a systemic infection.  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  She can follow-up with dermatology.  Patient discharge.  Understands return precautions.  This chart was dictated using voice recognition software.  Despite best efforts to proofread,  errors can occur which can change the documentation meaning.      Final diagnoses:  Skin infection  Near syncope    ED Discharge Orders     None          Ruthe Cornet, DO 01/10/24 1618

## 2024-01-10 NOTE — ED Triage Notes (Addendum)
 Reports near syncopal episode this morning. Recently been treated for Staph infection. Denies CP or SHOB.   Was seen at [facility] yesterday for same.

## 2024-01-10 NOTE — ED Notes (Signed)
 Reviewed AVS/discharge instructions with patient. Time allotted for and all questions answered. Patient is agreeable for d/c and escorted to ED exit by staff.

## 2024-01-10 NOTE — Discharge Instructions (Addendum)
 Restart your clindamycin  as prescribed by your prior doctor.  Follow-up with your dermatologist and primary care.  Return if symptoms worsen.

## 2024-01-11 ENCOUNTER — Other Ambulatory Visit (HOSPITAL_BASED_OUTPATIENT_CLINIC_OR_DEPARTMENT_OTHER): Payer: Self-pay

## 2024-01-18 ENCOUNTER — Other Ambulatory Visit (HOSPITAL_BASED_OUTPATIENT_CLINIC_OR_DEPARTMENT_OTHER): Payer: Self-pay

## 2024-01-18 MED ORDER — PREDNISONE 50 MG PO TABS
ORAL_TABLET | ORAL | 0 refills | Status: DC
  Filled 2024-01-18: qty 3, 1d supply, fill #0

## 2024-01-19 ENCOUNTER — Other Ambulatory Visit (HOSPITAL_BASED_OUTPATIENT_CLINIC_OR_DEPARTMENT_OTHER): Payer: Self-pay

## 2024-01-20 ENCOUNTER — Other Ambulatory Visit (HOSPITAL_COMMUNITY): Payer: Self-pay

## 2024-01-24 ENCOUNTER — Other Ambulatory Visit (HOSPITAL_BASED_OUTPATIENT_CLINIC_OR_DEPARTMENT_OTHER): Payer: Self-pay

## 2024-01-24 MED ORDER — BUPRENORPHINE HCL-NALOXONE HCL 8-2 MG SL FILM
1.2500 | ORAL_FILM | Freq: Every day | SUBLINGUAL | 0 refills | Status: DC
  Filled 2024-01-24: qty 27, 21d supply, fill #0

## 2024-01-24 MED ORDER — HYDROXYZINE HCL 25 MG PO TABS
25.0000 mg | ORAL_TABLET | Freq: Three times a day (TID) | ORAL | 1 refills | Status: AC | PRN
  Filled 2024-01-24: qty 90, 30d supply, fill #0

## 2024-01-28 ENCOUNTER — Encounter (INDEPENDENT_AMBULATORY_CARE_PROVIDER_SITE_OTHER): Payer: Self-pay

## 2024-02-05 ENCOUNTER — Other Ambulatory Visit: Payer: Self-pay

## 2024-02-05 ENCOUNTER — Encounter (HOSPITAL_BASED_OUTPATIENT_CLINIC_OR_DEPARTMENT_OTHER): Payer: Self-pay

## 2024-02-05 ENCOUNTER — Emergency Department (HOSPITAL_BASED_OUTPATIENT_CLINIC_OR_DEPARTMENT_OTHER)
Admission: EM | Admit: 2024-02-05 | Discharge: 2024-02-05 | Disposition: A | Discharge status: Home / Self Care | Attending: Emergency Medicine | Admitting: Emergency Medicine

## 2024-02-05 DIAGNOSIS — Z79899 Other long term (current) drug therapy: Secondary | ICD-10-CM | POA: Insufficient documentation

## 2024-02-05 DIAGNOSIS — Z91040 Latex allergy status: Secondary | ICD-10-CM | POA: Diagnosis not present

## 2024-02-05 DIAGNOSIS — R197 Diarrhea, unspecified: Secondary | ICD-10-CM | POA: Diagnosis present

## 2024-02-05 LAB — CBC
HCT: 32.5 % — ABNORMAL LOW (ref 36.0–46.0)
Hemoglobin: 10.5 g/dL — ABNORMAL LOW (ref 12.0–15.0)
MCH: 31.8 pg (ref 26.0–34.0)
MCHC: 32.3 g/dL (ref 30.0–36.0)
MCV: 98.5 fL (ref 80.0–100.0)
Platelets: 346 K/uL (ref 150–400)
RBC: 3.3 MIL/uL — ABNORMAL LOW (ref 3.87–5.11)
RDW: 15.7 % — ABNORMAL HIGH (ref 11.5–15.5)
WBC: 7.3 K/uL (ref 4.0–10.5)
nRBC: 0 % (ref 0.0–0.2)

## 2024-02-05 LAB — COMPREHENSIVE METABOLIC PANEL WITH GFR
ALT: 22 U/L (ref 0–44)
AST: 33 U/L (ref 15–41)
Albumin: 4.1 g/dL (ref 3.5–5.0)
Alkaline Phosphatase: 107 U/L (ref 38–126)
Anion gap: 8 (ref 5–15)
BUN: 9 mg/dL (ref 6–20)
CO2: 28 mmol/L (ref 22–32)
Calcium: 9.1 mg/dL (ref 8.9–10.3)
Chloride: 106 mmol/L (ref 98–111)
Creatinine, Ser: 0.7 mg/dL (ref 0.44–1.00)
GFR, Estimated: >60 mL/min (ref >60)
Glucose, Bld: 105 mg/dL — ABNORMAL HIGH (ref 70–99)
Potassium: 3.8 mmol/L (ref 3.5–5.1)
Sodium: 142 mmol/L (ref 135–145)
Total Bilirubin: <0.2 mg/dL (ref 0.0–1.2)
Total Protein: 6.3 g/dL — ABNORMAL LOW (ref 6.5–8.1)

## 2024-02-05 LAB — RAPID HIV SCREEN (HIV 1/2 AB+AG)
HIV 1/2 Antibodies: NONREACTIVE
HIV-1 P24 Antigen - HIV24: NONREACTIVE

## 2024-02-05 LAB — URINALYSIS, ROUTINE W REFLEX MICROSCOPIC
Bacteria, UA: NONE SEEN
Bilirubin Urine: NEGATIVE
Glucose, UA: NEGATIVE mg/dL
Hgb urine dipstick: NEGATIVE
Ketones, ur: NEGATIVE mg/dL
Leukocytes,Ua: NEGATIVE
Nitrite: NEGATIVE
Protein, ur: 30 mg/dL — AB
Specific Gravity, Urine: 1.031 — ABNORMAL HIGH (ref 1.005–1.030)
pH: 5 (ref 5.0–8.0)

## 2024-02-05 LAB — MAGNESIUM: Magnesium: 2.3 mg/dL (ref 1.7–2.4)

## 2024-02-05 LAB — LIPASE, BLOOD: Lipase: 44 U/L (ref 11–51)

## 2024-02-05 LAB — HEPATITIS B SURFACE ANTIGEN: Hepatitis B Surface Ag: NONREACTIVE

## 2024-02-05 MED ORDER — VANCOMYCIN 50 MG/ML ORAL SOLUTION: 125.0000 mg | Freq: Four times a day (QID) | ORAL | 0 refills | Status: AC

## 2024-02-05 NOTE — ED Triage Notes (Addendum)
 Patient reports painful, blood, loose stools for about 1.5 weeks. Says she has been antibiotics for over a month for a skin condition. She is concerned for C. Diff and her doctors told her to be seen.   She has multiple skin lesions noted.

## 2024-02-05 NOTE — Discharge Instructions (Signed)
 Follow-up with your primary care doctor.  Start vancomycin as prescribed.  Return if symptoms worsen.  Consider recollection of your stool sample with your primary care doctor as we did not get enough stool to send off a GI stool pathogen panel as we discussed.

## 2024-02-05 NOTE — ED Notes (Signed)
 Lab advised stool sample was only enough to complete Cdiff.

## 2024-02-05 NOTE — ED Provider Notes (Signed)
 [facility] AT [facility] [place] Provider Note   CSN: [id number] Arrival date & time: 02/05/24  1813     Patient presents with: Diarrhea   Mariah Reyes is a 53 y.o. female.   Patient here with concern for C. difficile.  She has been having loose bloody stools for the last week and a half similar to her C. difficile in the past.  She was on clindamycin  for several weeks for chronic skin condition.  She is not having any abdominal pain fever or chills.  She has been having ongoing cramping abdominal pain at times with diarrhea.  No vomiting.  She is sent here to be tested for C. difficile.  She has had it 2 or 3 times before in the past all done well with oral vancomycin which she prefers.  The history is provided by the patient.       Prior to Admission medications   Medication Sig Start Date End Date Taking? Authorizing Provider  vancomycin (VANCOCIN) 50 mg/mL SOLN oral solution Take 2.5 mLs (125 mg total) by mouth every 6 (six) hours for 10 days. 02/05/24 02/15/24 Yes Breya Cass, DO  acetaminophen  (TYLENOL ) 500 MG tablet Take 1,000 mg by mouth 2 (two) times daily as needed for mild pain.     [provider]  albuterol  (PROVENTIL  HFA;VENTOLIN  HFA) 108 (90 BASE) MCG/ACT inhaler Inhale 2 puffs into the lungs every 6 (six) hours as needed for wheezing or shortness of breath.    [provider]  ALPRAZolam  (XANAX  XR) 0.5 MG 24 hr tablet Take 0.5 mg by mouth 2 (two) times daily.    [provider]  amphetamine -dextroamphetamine  (ADDERALL  XR) 20 MG 24 hr capsule Take 20 mg by mouth daily. 09/26/14   [provider]  BRIXADI  128 MG/0.36ML SOSY  01/02/24   [provider]  budesonide (PULMICORT) 1 MG/2ML nebulizer solution Take 1 mg by nebulization See admin instructions.    [provider]  Buprenorphine  HCl-Naloxone  HCl 8-2 MG FILM Place 1.25 (1 and 1/4)  Film under the tongue daily as directed. 01/24/24      cloNIDine  (CATAPRES ) 0.1 MG tablet Take 1 tablet (0.1 mg total) by mouth 3 (three) times daily as needed. Use for withdrawals or cravings Hold for SBP <90 or DBP <60 12/26/23     Dapsone  5 % topical gel Apply 1 application topically 2 (two) times daily. 11/19/13   Withrow, Norleen BROCKS, FNP  diazepam  (VALIUM ) 5 MG tablet Take 2 tablets (10 mg total) by mouth 30-40 minutes prior to procedure as directed 01/10/24     diphenhydrAMINE  (BENADRYL ) 25 mg capsule Take 25 mg by mouth every 6 (six) hours as needed (itching).    [provider]  EPINEPHrine (EPIPEN 2-PAK) 0.3 mg/0.3 mL IJ SOAJ injection Inject 0.3 mg into the muscle once.    [provider]  FLUoxetine  HCl 60 MG TABS Take 60 mg by mouth daily. 11/19/13   Withrow, Norleen BROCKS, FNP  fluticasone  (FLOVENT  HFA) 110 MCG/ACT inhaler Inhale 2 puffs into the lungs 2 (two) times daily. 11/19/13   Withrow, Norleen BROCKS, FNP  gabapentin  (NEURONTIN ) 100 MG capsule Take 1 capsule (100 mg total) by mouth 3 (three) times daily. Patient not taking: Reported on 06/05/2015 11/19/13   [place] Norleen BROCKS, FNP  hydrOXYzine  (ATARAX ) 25 MG tablet Take 1 tablet by mouth three times a day as needed for anxiety or sleep 01/24/24     imatinib (GLEEVEC) 100 MG tablet Take by mouth.  [provider]  levETIRAcetam (KEPPRA) 250 MG tablet Take 250 mg by mouth at bedtime.    [provider]  levocetirizine (XYZAL) 5 MG tablet Take 5 mg by mouth daily.    [provider]  levothyroxine  (SYNTHROID , LEVOTHROID) 50 MCG tablet Take 1 tablet (50 mcg total) by mouth daily before breakfast. 11/19/13   Withrow, Norleen BROCKS, FNP  Meth-Hyo-M Bl-Na Phos-Ph Sal (URIBEL PO) Take 1 tablet by mouth 4 (four) times daily as needed (bladder pain).    [provider]  naproxen  sodium (ANAPROX ) 220 MG tablet Take 220 mg by mouth 2 (two) times daily as needed (pain).    [provider]  oxyCODONE  (OXYCONTIN ) 10 mg 12 hr tablet Take 1 tablet (10 mg total) by mouth 3  (three) times daily for pain 12/15/23     predniSONE (DELTASONE) 50 MG tablet Take one tablet by mouth 13 hours prior to, 7 hours prior to, and 1 hour prior to procedure 01/18/24     Probiotic Product (PROBIOTIC PO) Take 1 capsule by mouth daily.    [provider]  pseudoephedrine (SUDAFED) 120 MG 12 hr tablet Take 120 mg by mouth daily as needed for congestion.    [provider]  tiZANidine (ZANAFLEX) 4 MG tablet Take 4 mg by mouth 3 (three) times daily as needed for muscle spasms.  04/26/15   [provider]  zolpidem  (AMBIEN  CR) 12.5 MG CR tablet Take 1 tablet (12.5 mg total) by mouth at bedtime. 11/19/13   Withrow, Norleen BROCKS, FNP    Allergies: Keflex  [cephalexin ], Latex, Nickel, Sulfa antibiotics, Other, Ciprofloxacin hcl, and Doxycycline    Review of Systems  Updated Vital Signs BP (!) 123/98 (BP Location: Right Arm)   Pulse 90   Temp 99.2 F (37.3 C)   Resp 17   LMP 09/15/2013   SpO2 97%   Physical Exam Vitals and nursing note reviewed.  Constitutional:      General: She is not in acute distress.    Appearance: She is well-developed. She is not ill-appearing.  HENT:     Head: Normocephalic and atraumatic.     Mouth/Throat:     Mouth: Mucous membranes are moist.  Eyes:     Conjunctiva/sclera: Conjunctivae normal.     Pupils: Pupils are equal, round, and reactive to light.  Cardiovascular:     Rate and Rhythm: Normal rate and regular rhythm.     Pulses: Normal pulses.     Heart sounds: Normal heart sounds. No murmur heard. Pulmonary:     Effort: Pulmonary effort is normal. No respiratory distress.     Breath sounds: Normal breath sounds.  Abdominal:     Palpations: Abdomen is soft.     Tenderness: There is no abdominal tenderness.  Musculoskeletal:        General: No swelling.     Cervical back: Neck supple.  Skin:    General: Skin is warm and dry.     Capillary Refill: Capillary refill takes less than 2 seconds.  Neurological:     Mental  Status: She is alert.  Psychiatric:        Mood and Affect: Mood normal.     (all labs ordered are listed, but only abnormal results are displayed) Labs Reviewed  COMPREHENSIVE METABOLIC PANEL WITH GFR - Abnormal; Notable for the following components:      Result Value   Glucose, Bld 105 (*)    Total Protein 6.3 (*)    All other  components within normal limits  CBC - Abnormal; Notable for the following components:   RBC 3.30 (*)    Hemoglobin 10.5 (*)    HCT 32.5 (*)    RDW 15.7 (*)    All other components within normal limits  URINALYSIS, ROUTINE W REFLEX MICROSCOPIC - Abnormal; Notable for the following components:   Specific Gravity, Urine 1.031 (*)    Protein, ur 30 (*)    All other components within normal limits  C DIFFICILE QUICK SCREEN W PCR REFLEX    LIPASE, BLOOD  MAGNESIUM     EKG: None  Radiology: No results found.   Procedures   Medications Ordered in the ED - No data to display                                  Medical Decision Making Amount and/or Complexity of Data Reviewed Labs: ordered.   Mariah Reyes is here with diarrhea.  History of C. difficile.  She has been on long course of clindamycin  for chronic skin condition.  Now discontinued.  She has unremarkable vitals.  No fever.  No abdominal pain.  Diarrhea now for the last week and a half.  She has had good success with oral vancomycin for C. difficile in the past.  Will empirically treat with that while we await C. difficile panel.  Was not able to give us  enough sample for GI stool pathogen panel.  Recommend that she follow-up with her primary care doctor for that if her CT if test is negative.  Lab work was otherwise unremarkable.  No significant leukocytosis anemia or electrolyte abnormality or kidney injury.  I have no concern for toxic megacolon any other acute process.  She has no abdominal pain no fever.  No signs of dehydration on lab work.  Continue supportive care at home.  Will start oral  vancomycin.  Recommend close follow-up with primary care doctor.  Discharged in good condition.  This chart was dictated using voice recognition software.  Despite best efforts to proofread,  errors can occur which can change the documentation meaning.      Final diagnoses:  Diarrhea, unspecified type    ED Discharge Orders          Ordered    vancomycin (VANCOCIN) 50 mg/mL SOLN oral solution  Every 6 hours        02/05/24 2146               Ruthe Cornet, DO 02/05/24 2149

## 2024-02-06 LAB — C DIFFICILE QUICK SCREEN W PCR REFLEX
C Diff antigen: NEGATIVE
C Diff interpretation: NOT DETECTED
C Diff toxin: NEGATIVE

## 2024-02-07 ENCOUNTER — Other Ambulatory Visit: Payer: Self-pay

## 2024-02-07 ENCOUNTER — Other Ambulatory Visit (HOSPITAL_BASED_OUTPATIENT_CLINIC_OR_DEPARTMENT_OTHER): Payer: Self-pay

## 2024-02-07 LAB — HCV AB W REFLEX TO QUANT PCR: HCV Ab: NONREACTIVE

## 2024-02-07 LAB — HCV INTERPRETATION

## 2024-02-07 MED ORDER — BUPRENORPHINE HCL-NALOXONE HCL 8-2 MG SL FILM
ORAL_FILM | Freq: Every day | SUBLINGUAL | 0 refills | Status: AC
  Filled 2024-02-07: qty 27, 21d supply, fill #0
  Filled 2024-02-07: qty 27, 27d supply, fill #0
  Filled 2024-02-29: qty 27, 21d supply, fill #0

## 2024-02-08 ENCOUNTER — Other Ambulatory Visit (HOSPITAL_BASED_OUTPATIENT_CLINIC_OR_DEPARTMENT_OTHER): Payer: Self-pay

## 2024-02-09 ENCOUNTER — Other Ambulatory Visit (HOSPITAL_BASED_OUTPATIENT_CLINIC_OR_DEPARTMENT_OTHER): Payer: Self-pay

## 2024-02-09 MED ORDER — BUPRENORPHINE HCL-NALOXONE HCL 8-2 MG SL FILM
1.0000 | ORAL_FILM | Freq: Four times a day (QID) | SUBLINGUAL | 0 refills | Status: AC
  Filled 2024-02-09: qty 42, 21d supply, fill #0

## 2024-02-14 ENCOUNTER — Other Ambulatory Visit (HOSPITAL_BASED_OUTPATIENT_CLINIC_OR_DEPARTMENT_OTHER): Payer: Self-pay

## 2024-02-15 ENCOUNTER — Other Ambulatory Visit (HOSPITAL_BASED_OUTPATIENT_CLINIC_OR_DEPARTMENT_OTHER): Payer: Self-pay

## 2024-02-15 MED ORDER — DOXYCYCLINE HYCLATE 100 MG PO CAPS
100.0000 mg | ORAL_CAPSULE | Freq: Two times a day (BID) | ORAL | 0 refills | Status: AC
  Filled 2024-02-15: qty 20, 10d supply, fill #0

## 2024-02-21 ENCOUNTER — Other Ambulatory Visit (HOSPITAL_BASED_OUTPATIENT_CLINIC_OR_DEPARTMENT_OTHER): Payer: Self-pay

## 2024-02-21 ENCOUNTER — Other Ambulatory Visit: Payer: Self-pay

## 2024-02-21 MED ORDER — FLUOXETINE HCL 20 MG PO CAPS
20.0000 mg | ORAL_CAPSULE | Freq: Every morning | ORAL | 5 refills | Status: AC
  Filled 2024-02-21 – 2024-03-27 (×3): qty 30, 30d supply, fill #0

## 2024-02-21 MED ORDER — ALPRAZOLAM 0.5 MG PO TABS
0.5000 mg | ORAL_TABLET | Freq: Every day | ORAL | 5 refills | Status: AC
  Filled 2024-02-21: qty 30, 30d supply, fill #0

## 2024-02-21 MED ORDER — ZOLPIDEM TARTRATE ER 12.5 MG PO TBCR
12.5000 mg | EXTENDED_RELEASE_TABLET | Freq: Every day | ORAL | 5 refills | Status: AC
  Filled 2024-02-21 – 2024-03-29 (×3): qty 30, 30d supply, fill #0

## 2024-02-21 MED ORDER — AMPHETAMINE-DEXTROAMPHET ER 15 MG PO CP24
15.0000 mg | ORAL_CAPSULE | Freq: Every morning | ORAL | 0 refills | Status: AC
  Filled 2024-02-21 – 2024-02-29 (×2): qty 30, 30d supply, fill #0
  Filled 2024-03-27: qty 20, 20d supply, fill #0
  Filled 2024-03-27: qty 10, 10d supply, fill #0

## 2024-02-21 MED ORDER — ALPRAZOLAM ER 0.5 MG PO TB24
0.5000 mg | ORAL_TABLET | Freq: Two times a day (BID) | ORAL | 5 refills | Status: AC | PRN
  Filled 2024-02-21 – 2024-03-01 (×2): qty 60, 30d supply, fill #0
  Filled 2024-04-02: qty 60, 30d supply, fill #1

## 2024-03-01 ENCOUNTER — Other Ambulatory Visit: Payer: Self-pay

## 2024-03-01 ENCOUNTER — Other Ambulatory Visit (HOSPITAL_BASED_OUTPATIENT_CLINIC_OR_DEPARTMENT_OTHER): Payer: Self-pay

## 2024-03-02 ENCOUNTER — Other Ambulatory Visit (HOSPITAL_BASED_OUTPATIENT_CLINIC_OR_DEPARTMENT_OTHER): Payer: Self-pay

## 2024-03-02 MED ORDER — ALPRAZOLAM 0.5 MG PO TABS
0.5000 mg | ORAL_TABLET | Freq: Every day | ORAL | 5 refills | Status: AC | PRN
  Filled 2024-03-02: qty 30, 30d supply, fill #0

## 2024-03-02 MED ORDER — ALPRAZOLAM ER 0.5 MG PO TB24
0.5000 mg | ORAL_TABLET | Freq: Two times a day (BID) | ORAL | 5 refills | Status: AC | PRN
  Filled 2024-03-02 – 2024-05-03 (×4): qty 60, 30d supply, fill #0

## 2024-03-05 ENCOUNTER — Other Ambulatory Visit (HOSPITAL_BASED_OUTPATIENT_CLINIC_OR_DEPARTMENT_OTHER): Payer: Self-pay

## 2024-03-07 ENCOUNTER — Other Ambulatory Visit (HOSPITAL_BASED_OUTPATIENT_CLINIC_OR_DEPARTMENT_OTHER): Payer: Self-pay

## 2024-03-07 MED ORDER — DOXYCYCLINE HYCLATE 100 MG PO TABS
100.0000 mg | ORAL_TABLET | Freq: Two times a day (BID) | ORAL | 0 refills | Status: AC
  Filled 2024-03-07: qty 14, 7d supply, fill #0

## 2024-03-08 ENCOUNTER — Other Ambulatory Visit: Payer: Self-pay

## 2024-03-08 ENCOUNTER — Other Ambulatory Visit (HOSPITAL_BASED_OUTPATIENT_CLINIC_OR_DEPARTMENT_OTHER): Payer: Self-pay

## 2024-03-08 MED ORDER — DUPIXENT 300 MG/2ML ~~LOC~~ SOAJ: SUBCUTANEOUS | 11 refills | Status: AC

## 2024-03-08 MED ORDER — DUPIXENT 300 MG/2ML ~~LOC~~ SOAJ
SUBCUTANEOUS | 0 refills | Status: AC
  Filled 2024-03-08 – 2024-03-27 (×2): qty 4, 14d supply, fill #0

## 2024-03-09 ENCOUNTER — Other Ambulatory Visit (HOSPITAL_BASED_OUTPATIENT_CLINIC_OR_DEPARTMENT_OTHER): Payer: Self-pay

## 2024-03-09 ENCOUNTER — Other Ambulatory Visit: Payer: Self-pay

## 2024-03-09 NOTE — Progress Notes (Signed)
 Patient must fill with [facility]. Left message with office asking them to send prescription to preferred pharmacy. Dis-enrolling.

## 2024-03-17 ENCOUNTER — Encounter (HOSPITAL_BASED_OUTPATIENT_CLINIC_OR_DEPARTMENT_OTHER): Payer: Self-pay

## 2024-03-18 ENCOUNTER — Other Ambulatory Visit (HOSPITAL_BASED_OUTPATIENT_CLINIC_OR_DEPARTMENT_OTHER): Payer: Self-pay

## 2024-03-23 ENCOUNTER — Other Ambulatory Visit (HOSPITAL_BASED_OUTPATIENT_CLINIC_OR_DEPARTMENT_OTHER): Payer: Self-pay

## 2024-03-23 MED ORDER — FLUOCINOLONE ACETONIDE 0.01 % OT OIL
2.0000 [drp] | TOPICAL_OIL | OTIC | 12 refills | Status: AC
  Filled 2024-03-23: qty 20, 90d supply, fill #0

## 2024-03-27 ENCOUNTER — Other Ambulatory Visit (HOSPITAL_BASED_OUTPATIENT_CLINIC_OR_DEPARTMENT_OTHER): Payer: Self-pay

## 2024-03-27 ENCOUNTER — Other Ambulatory Visit (HOSPITAL_COMMUNITY): Payer: Self-pay

## 2024-03-27 ENCOUNTER — Other Ambulatory Visit: Payer: Self-pay

## 2024-03-28 ENCOUNTER — Other Ambulatory Visit (HOSPITAL_BASED_OUTPATIENT_CLINIC_OR_DEPARTMENT_OTHER): Payer: Self-pay

## 2024-03-29 ENCOUNTER — Other Ambulatory Visit (HOSPITAL_BASED_OUTPATIENT_CLINIC_OR_DEPARTMENT_OTHER): Payer: Self-pay

## 2024-04-02 ENCOUNTER — Other Ambulatory Visit: Payer: Self-pay

## 2024-04-02 ENCOUNTER — Other Ambulatory Visit (HOSPITAL_BASED_OUTPATIENT_CLINIC_OR_DEPARTMENT_OTHER): Payer: Self-pay

## 2024-04-02 MED ORDER — BUPRENORPHINE HCL-NALOXONE HCL 8-2 MG SL FILM
ORAL_FILM | SUBLINGUAL | 0 refills | Status: AC
  Filled 2024-04-02: qty 56, 28d supply, fill #0

## 2024-04-03 ENCOUNTER — Other Ambulatory Visit (HOSPITAL_BASED_OUTPATIENT_CLINIC_OR_DEPARTMENT_OTHER): Payer: Self-pay

## 2024-04-03 ENCOUNTER — Other Ambulatory Visit: Payer: Self-pay

## 2024-04-04 ENCOUNTER — Other Ambulatory Visit (HOSPITAL_BASED_OUTPATIENT_CLINIC_OR_DEPARTMENT_OTHER): Payer: Self-pay

## 2024-05-04 ENCOUNTER — Other Ambulatory Visit (HOSPITAL_BASED_OUTPATIENT_CLINIC_OR_DEPARTMENT_OTHER): Payer: Self-pay

## 2024-05-04 ENCOUNTER — Other Ambulatory Visit: Payer: Self-pay

## 2024-05-04 MED ORDER — AMPHETAMINE-DEXTROAMPHET ER 15 MG PO CP24
15.0000 mg | ORAL_CAPSULE | Freq: Every day | ORAL | 0 refills | Status: AC | PRN
  Filled 2024-05-04: qty 30, 30d supply, fill #0

## 2024-05-08 ENCOUNTER — Encounter (HOSPITAL_BASED_OUTPATIENT_CLINIC_OR_DEPARTMENT_OTHER): Payer: Self-pay

## 2024-05-08 ENCOUNTER — Other Ambulatory Visit: Payer: Self-pay

## 2024-05-08 ENCOUNTER — Emergency Department (HOSPITAL_BASED_OUTPATIENT_CLINIC_OR_DEPARTMENT_OTHER)
Admission: EM | Admit: 2024-05-08 | Discharge: 2024-05-09 | Disposition: A | Discharge status: Home / Self Care | Payer: Self-pay | Attending: Emergency Medicine | Admitting: Emergency Medicine

## 2024-05-08 DIAGNOSIS — Z91040 Latex allergy status: Secondary | ICD-10-CM | POA: Insufficient documentation

## 2024-05-08 DIAGNOSIS — R1031 Right lower quadrant pain: Secondary | ICD-10-CM | POA: Insufficient documentation

## 2024-05-08 DIAGNOSIS — R110 Nausea: Secondary | ICD-10-CM | POA: Diagnosis not present

## 2024-05-08 DIAGNOSIS — Z79899 Other long term (current) drug therapy: Secondary | ICD-10-CM | POA: Diagnosis not present

## 2024-05-08 LAB — URINALYSIS, ROUTINE W REFLEX MICROSCOPIC
Bacteria, UA: NONE SEEN
Bilirubin Urine: NEGATIVE
Glucose, UA: NEGATIVE mg/dL
Hgb urine dipstick: NEGATIVE
Ketones, ur: NEGATIVE mg/dL
Leukocytes,Ua: NEGATIVE
Nitrite: NEGATIVE
Protein, ur: 30 mg/dL — AB
Specific Gravity, Urine: 1.029 (ref 1.005–1.030)
pH: 5.5 (ref 5.0–8.0)

## 2024-05-08 NOTE — ED Notes (Signed)
 Attempted IV access x2 unsuccessful

## 2024-05-08 NOTE — ED Triage Notes (Signed)
 Pt c/o rt. Lower quad pain started 2 hrs ago +nausea Pain 7/10 Sharp cramping pain Crying in triage

## 2024-05-09 ENCOUNTER — Emergency Department (HOSPITAL_BASED_OUTPATIENT_CLINIC_OR_DEPARTMENT_OTHER): Payer: Self-pay

## 2024-05-09 ENCOUNTER — Other Ambulatory Visit (HOSPITAL_BASED_OUTPATIENT_CLINIC_OR_DEPARTMENT_OTHER): Payer: Self-pay

## 2024-05-09 LAB — CBC WITH DIFFERENTIAL/PLATELET
Abs Immature Granulocytes: 0.01 K/uL (ref 0.00–0.07)
Basophils Absolute: 0.1 K/uL (ref 0.0–0.1)
Basophils Relative: 2 %
Eosinophils Absolute: 0.3 K/uL (ref 0.0–0.5)
Eosinophils Relative: 6 %
HCT: 33.2 % — ABNORMAL LOW (ref 36.0–46.0)
Hemoglobin: 11.1 g/dL — ABNORMAL LOW (ref 12.0–15.0)
Immature Granulocytes: 0 %
Lymphocytes Relative: 36 %
Lymphs Abs: 1.9 K/uL (ref 0.7–4.0)
MCH: 31.4 pg (ref 26.0–34.0)
MCHC: 33.4 g/dL (ref 30.0–36.0)
MCV: 93.8 fL (ref 80.0–100.0)
Monocytes Absolute: 0.4 K/uL (ref 0.1–1.0)
Monocytes Relative: 8 %
Neutro Abs: 2.5 K/uL (ref 1.7–7.7)
Neutrophils Relative %: 48 %
Platelets: 286 K/uL (ref 150–400)
RBC: 3.54 MIL/uL — ABNORMAL LOW (ref 3.87–5.11)
RDW: 14 % (ref 11.5–15.5)
WBC: 5.2 K/uL (ref 4.0–10.5)
nRBC: 0 % (ref 0.0–0.2)

## 2024-05-09 LAB — COMPREHENSIVE METABOLIC PANEL WITH GFR
ALT: 21 U/L (ref 0–44)
AST: 47 U/L — ABNORMAL HIGH (ref 15–41)
Albumin: 4.2 g/dL (ref 3.5–5.0)
Alkaline Phosphatase: 99 U/L (ref 38–126)
Anion gap: 12 (ref 5–15)
BUN: 13 mg/dL (ref 6–20)
CO2: 26 mmol/L (ref 22–32)
Calcium: 9 mg/dL (ref 8.9–10.3)
Chloride: 100 mmol/L (ref 98–111)
Creatinine, Ser: 0.76 mg/dL (ref 0.44–1.00)
GFR, Estimated: >60 mL/min
Glucose, Bld: 92 mg/dL (ref 70–99)
Potassium: 3.4 mmol/L — ABNORMAL LOW (ref 3.5–5.1)
Sodium: 138 mmol/L (ref 135–145)
Total Bilirubin: 0.3 mg/dL (ref 0.0–1.2)
Total Protein: 6.6 g/dL (ref 6.5–8.1)

## 2024-05-09 LAB — LIPASE, BLOOD: Lipase: 37 U/L (ref 11–51)

## 2024-05-09 MED ORDER — HYDROCODONE-ACETAMINOPHEN 5-325 MG PO TABS
1.0000 | ORAL_TABLET | Freq: Four times a day (QID) | ORAL | 0 refills | Status: AC | PRN
  Filled 2024-05-09: qty 10, 2d supply, fill #0

## 2024-05-09 MED ORDER — KETOROLAC TROMETHAMINE 30 MG/ML IJ SOLN
30.0000 mg | Freq: Once | INTRAMUSCULAR | Status: AC
  Administered 2024-05-09: 30 mg via INTRAVENOUS
  Filled 2024-05-09: qty 1

## 2024-05-09 MED ORDER — ONDANSETRON HCL 4 MG/2ML IJ SOLN
4.0000 mg | Freq: Once | INTRAMUSCULAR | Status: AC
  Administered 2024-05-09: 4 mg via INTRAVENOUS
  Filled 2024-05-09: qty 2

## 2024-05-09 MED ORDER — MORPHINE SULFATE (PF) 4 MG/ML IV SOLN
4.0000 mg | Freq: Once | INTRAVENOUS | Status: AC
  Administered 2024-05-09: 4 mg via INTRAVENOUS
  Filled 2024-05-09: qty 1

## 2024-05-09 NOTE — ED Provider Notes (Signed)
 " [place] [facility]ENC[facility]PARTMENT AT [facility] Provider Note   CSN: [id number] Arrival date & time: 05/08/24  2235     Patient presents with: Abdominal Pain   Mariah Reyes is a 54 y.o. female.  {Add pertinent medical, surgical, social history, OB history to YEP:67052} Patient is a 54 year old female presenting with complaints of right lower quadrant pain.  Symptoms started just prior to presentation and began in the absence of any injury or trauma.  She describes severe pain to the right lower quadrant that has made her feel nauseated and sweaty.  She denies any bowel or bladder complaints.  No fevers or chills.  She has undergone prior cholecystectomy and hysterectomy.  No aggravating or alleviating factors.       Prior to Admission medications  Medication Sig Start Date End Date Taking? Authorizing Provider  acetaminophen  (TYLENOL ) 500 MG tablet Take 1,000 mg by mouth 2 (two) times daily as needed for mild pain.     [provider]  albuterol  (PROVENTIL  HFA;VENTOLIN  HFA) 108 (90 BASE) MCG/ACT inhaler Inhale 2 puffs into the lungs every 6 (six) hours as needed for wheezing or shortness of breath.    [provider]  ALPRAZolam  (XANAX  XR) 0.5 MG 24 hr tablet Take 0.5 mg by mouth 2 (two) times daily.    [provider]  ALPRAZolam  (XANAX  XR) 0.5 MG 24 hr tablet Take 1 tablet (0.5 mg total) by mouth 2 (two) times daily as needed. 02/21/24     ALPRAZolam  (XANAX  XR) 0.5 MG 24 hr tablet Take 1 tablet (0.5 mg total) by mouth 2 (two) times daily as needed. 03/02/24     ALPRAZolam  (XANAX ) 0.5 MG tablet Take 1 tablet (0.5 mg total) by mouth daily as needed only if unable to sleep 02/21/24     ALPRAZolam  (XANAX ) 0.5 MG tablet Take 1 tablet (0.5 mg total) by mouth daily as needed only if unable to sleep. 03/02/24     amphetamine -dextroamphetamine  (ADDERALL  XR) 15 MG 24 hr capsule Take 1 capsule by mouth in the morning. 02/21/24     amphetamine -dextroamphetamine   (ADDERALL  XR) 15 MG 24 hr capsule Take 1 capsule by mouth daily as needed. 05/04/24     amphetamine -dextroamphetamine  (ADDERALL  XR) 20 MG 24 hr capsule Take 20 mg by mouth daily. 09/26/14   [provider]  BRIXADI  128 MG/0.36ML SOSY  01/02/24   [provider]  budesonide (PULMICORT) 1 MG/2ML nebulizer solution Take 1 mg by nebulization See admin instructions.    [provider]  Buprenorphine  HCl-Naloxone  HCl 8-2 MG FILM Place 1.25 (1 and 1/4)  Film under the tongue daily as directed. 02/07/24     Buprenorphine  HCl-Naloxone  HCl 8-2 MG FILM Place 0.5 (ONE-HALF) Film under the tongue 4 (four) times daily as needed for withdrawal symptoms. 02/09/24     Buprenorphine  HCl-Naloxone  HCl 8-2 MG FILM Place 0.5 (ONE-HALF) Film under the tongue 4 (four) times daily as needed for withdrawal symptoms. 04/02/24     cloNIDine  (CATAPRES ) 0.1 MG tablet Take 1 tablet (0.1 mg total) by mouth 3 (three) times daily as needed. Use for withdrawals or cravings Hold for SBP <90 or DBP <60 12/26/23     Dapsone  5 % topical gel Apply 1 application topically 2 (two) times daily. 11/19/13   Withrow, Norleen BROCKS, FNP  diphenhydrAMINE  (BENADRYL ) 25 mg capsule Take 25 mg by mouth every 6 (six) hours as needed (itching).    [provider]  doxycycline  (VIBRA -TABS) 100 MG tablet Take 1  tablet (100 mg total) by mouth 2 (two) times daily. Always take with food.  Strict sun protection. 03/07/24     Dupilumab  (DUPIXENT ) 300 MG/2ML SOAJ Inject 2 mLs (300 mg total) subcutaneously every 14 (fourteen) days 03/07/24     Dupilumab  (DUPIXENT ) 300 MG/2ML SOAJ Inject 4 mLs (600 mg total) subcutaneously once for 1 dose At treatment start 03/07/24     EPINEPHrine (EPIPEN 2-PAK) 0.3 mg/0.3 mL IJ SOAJ injection Inject 0.3 mg into the muscle once.    [provider]  Fluocinolone  Acetonide 0.01 % OIL Place 2-3 drops into affected ear(s) once a week. 03/23/24     FLUoxetine  (PROZAC ) 20 MG capsule Take 1 capsule (20 mg  total) by mouth every morning with food 02/21/24     FLUoxetine  HCl 60 MG TABS Take 60 mg by mouth daily. 11/19/13   Withrow, Norleen BROCKS, FNP  fluticasone  (FLOVENT  HFA) 110 MCG/ACT inhaler Inhale 2 puffs into the lungs 2 (two) times daily. 11/19/13   Withrow, Norleen BROCKS, FNP  gabapentin  (NEURONTIN ) 100 MG capsule Take 1 capsule (100 mg total) by mouth 3 (three) times daily. Patient not taking: Reported on 06/05/2015 11/19/13   Rowland Norleen BROCKS, FNP  hydrOXYzine  (ATARAX ) 25 MG tablet Take 1 tablet by mouth three times a day as needed for anxiety or sleep 01/24/24     imatinib (GLEEVEC) 100 MG tablet Take by mouth.    [provider]  levETIRAcetam (KEPPRA) 250 MG tablet Take 250 mg by mouth at bedtime.    [provider]  levocetirizine (XYZAL) 5 MG tablet Take 5 mg by mouth daily.    [provider]  levothyroxine  (SYNTHROID , LEVOTHROID) 50 MCG tablet Take 1 tablet (50 mcg total) by mouth daily before breakfast. 11/19/13   Withrow, Norleen BROCKS, FNP  Meth-Hyo-M Bl-Na Phos-Ph Sal (URIBEL PO) Take 1 tablet by mouth 4 (four) times daily as needed (bladder pain).    [provider]  naproxen  sodium (ANAPROX ) 220 MG tablet Take 220 mg by mouth 2 (two) times daily as needed (pain).    [provider]  oxyCODONE  (OXYCONTIN ) 10 mg 12 hr tablet Take 1 tablet (10 mg total) by mouth 3 (three) times daily for pain 12/15/23     Probiotic Product (PROBIOTIC PO) Take 1 capsule by mouth daily.    [provider]  pseudoephedrine (SUDAFED) 120 MG 12 hr tablet Take 120 mg by mouth daily as needed for congestion.    [provider]  tiZANidine (ZANAFLEX) 4 MG tablet Take 4 mg by mouth 3 (three) times daily as needed for muscle spasms.  04/26/15   [provider]  zolpidem  (AMBIEN  CR) 12.5 MG CR tablet Take 1 tablet (12.5 mg total) by mouth at bedtime. 11/19/13   Withrow, Norleen BROCKS, FNP  zolpidem  (AMBIEN  CR) 12.5 MG CR tablet Take 1 tablet (12.5 mg total) by mouth at bedtime.  02/21/24       Allergies: Keflex  [cephalexin ], Latex, Nickel, Sulfa antibiotics, Other, Ciprofloxacin hcl, and Doxycycline     Review of Systems  All other systems reviewed and are negative.   Updated Vital Signs BP (!) 154/128 (BP Location: Right Arm)   Pulse 81   Temp 98 F (36.7 C)   Resp 17   Ht 5' 3 (1.6 m)   Wt 74.4 kg   LMP 07/29/2013   SpO2 94%   BMI 29.05 kg/m   Physical Exam Vitals and nursing note reviewed.  Constitutional:      General: She  is not in acute distress.    Appearance: She is well-developed. She is not diaphoretic.  HENT:     Head: Normocephalic and atraumatic.  Cardiovascular:     Rate and Rhythm: Normal rate and regular rhythm.     Heart sounds: No murmur heard.    No friction rub. No gallop.  Pulmonary:     Effort: Pulmonary effort is normal. No respiratory distress.     Breath sounds: Normal breath sounds. No wheezing.  Abdominal:     General: Bowel sounds are normal. There is no distension.     Palpations: Abdomen is soft.     Tenderness: There is abdominal tenderness in the right lower quadrant. There is no right CVA tenderness, left CVA tenderness, guarding or rebound.  Musculoskeletal:        General: Normal range of motion.     Cervical back: Normal range of motion and neck supple.  Skin:    General: Skin is warm and dry.  Neurological:     General: No focal deficit present.     Mental Status: She is alert and oriented to person, place, and time.     (all labs ordered are listed, but only abnormal results are displayed) Labs Reviewed  URINALYSIS, ROUTINE W REFLEX MICROSCOPIC - Abnormal; Notable for the following components:      Result Value   Protein, ur 30 (*)    All other components within normal limits  CBC WITH DIFFERENTIAL/PLATELET  COMPREHENSIVE METABOLIC PANEL WITH GFR  LIPASE, BLOOD    EKG: None  Radiology: No results found.  {Document cardiac monitor, telemetry assessment procedure when  appropriate:32947} Procedures   Medications Ordered in the ED  ondansetron  (ZOFRAN ) injection 4 mg (has no administration in time range)  morphine  (PF) 4 MG/ML injection 4 mg (has no administration in time range)  ketorolac  (TORADOL ) 30 MG/ML injection 30 mg (has no administration in time range)      {Click here for ABCD2, HEART and other calculators REFRESH Note before signing:1}                              Medical Decision Making Amount and/or Complexity of Data Reviewed Labs: ordered. Radiology: ordered.  Risk Prescription drug management.   ***  {Document critical care time when appropriate  Document review of labs and clinical decision tools ie CHADS2VASC2, etc  Document your independent review of radiology images and any outside records  Document your discussion with family members, caretakers and with consultants  Document social determinants of health affecting pt's care  Document your decision making why or why not admission, treatments were needed:32947:::1}   Final diagnoses:  None    ED Discharge Orders     None        "

## 2024-05-09 NOTE — Discharge Instructions (Signed)
 Begin taking ibuprofen  600 mg every 6 hours as needed for pain.  Begin taking hydrocodone  as prescribed as needed for pain not relieved with ibuprofen .  Follow-up with primary doctor if not improving in the next few days, and return to the ER if symptoms significantly worsen or change.

## 2024-05-09 NOTE — ED Notes (Signed)
 Patient transported to CT

## 2024-05-09 NOTE — ED Notes (Signed)
 ..  The patient is A&OX4, ambulatory at d/c with independent steady gait, NAD. Pt verbalized understanding of d/c instructions, prescription and follow up care.

## 2024-05-16 ENCOUNTER — Other Ambulatory Visit (HOSPITAL_BASED_OUTPATIENT_CLINIC_OR_DEPARTMENT_OTHER): Payer: Self-pay

## 2024-05-16 MED ORDER — DICYCLOMINE HCL 20 MG PO TABS
20.0000 mg | ORAL_TABLET | Freq: Three times a day (TID) | ORAL | 3 refills | Status: AC | PRN
  Filled 2024-05-16: qty 90, 30d supply, fill #0
